# Patient Record
Sex: Male | Born: 1950 | Race: White | Hispanic: No | Marital: Married | Smoking: Former smoker
Health system: Southern US, Community
[De-identification: ages and names within clinical notes are randomized; demographics above are authoritative.]

## PROBLEM LIST (undated history)

## (undated) ENCOUNTER — Emergency Department (HOSPITAL_COMMUNITY)

## (undated) DIAGNOSIS — C069 Malignant neoplasm of mouth, unspecified: Secondary | ICD-10-CM

## (undated) DIAGNOSIS — E785 Hyperlipidemia, unspecified: Secondary | ICD-10-CM

## (undated) DIAGNOSIS — M109 Gout, unspecified: Secondary | ICD-10-CM

## (undated) DIAGNOSIS — E119 Type 2 diabetes mellitus without complications: Secondary | ICD-10-CM

## (undated) DIAGNOSIS — K219 Gastro-esophageal reflux disease without esophagitis: Secondary | ICD-10-CM

## (undated) DIAGNOSIS — Z9889 Other specified postprocedural states: Secondary | ICD-10-CM

## (undated) HISTORY — DX: Hyperlipidemia, unspecified: E78.5

## (undated) HISTORY — DX: Other specified postprocedural states: Z98.890

## (undated) HISTORY — PX: VASECTOMY: SHX75

## (undated) HISTORY — PX: COLONOSCOPY: SHX174

## (undated) HISTORY — PX: KNEE SURGERY: SHX244

---

## 2004-12-19 ENCOUNTER — Ambulatory Visit: Payer: Self-pay | Admitting: Family Medicine

## 2005-02-07 ENCOUNTER — Ambulatory Visit: Payer: Self-pay | Admitting: Internal Medicine

## 2005-03-10 ENCOUNTER — Ambulatory Visit: Payer: Self-pay | Admitting: Internal Medicine

## 2005-04-30 ENCOUNTER — Ambulatory Visit: Payer: Self-pay | Admitting: Internal Medicine

## 2005-06-25 ENCOUNTER — Ambulatory Visit (HOSPITAL_BASED_OUTPATIENT_CLINIC_OR_DEPARTMENT_OTHER): Admission: RE | Admit: 2005-06-25 | Discharge: 2005-06-25 | Payer: Self-pay | Admitting: Orthopedic Surgery

## 2005-06-25 ENCOUNTER — Ambulatory Visit (HOSPITAL_COMMUNITY): Admission: RE | Admit: 2005-06-25 | Discharge: 2005-06-25 | Payer: Self-pay | Admitting: Orthopedic Surgery

## 2013-04-21 DIAGNOSIS — S93401A Sprain of unspecified ligament of right ankle, initial encounter: Secondary | ICD-10-CM | POA: Insufficient documentation

## 2014-06-15 ENCOUNTER — Emergency Department: Payer: Self-pay | Admitting: Emergency Medicine

## 2016-01-21 ENCOUNTER — Other Ambulatory Visit: Payer: Self-pay

## 2016-01-21 ENCOUNTER — Ambulatory Visit (INDEPENDENT_AMBULATORY_CARE_PROVIDER_SITE_OTHER): Payer: No Typology Code available for payment source | Admitting: Gastroenterology

## 2016-01-21 ENCOUNTER — Other Ambulatory Visit: Payer: Self-pay | Admitting: Gastroenterology

## 2016-01-21 ENCOUNTER — Encounter: Payer: Self-pay | Admitting: Gastroenterology

## 2016-01-21 VITALS — BP 103/65 | HR 80 | Temp 97.6°F | Ht 73.0 in | Wt 210.0 lb

## 2016-01-21 DIAGNOSIS — R1310 Dysphagia, unspecified: Secondary | ICD-10-CM

## 2016-01-21 NOTE — Progress Notes (Signed)
Gastroenterology Consultation  Referring Provider:     No ref. provider found Primary Care Physician:  [facility] Primary Gastroenterologist:  Dr. Allen Norris     Reason for Consultation:     Dysphasia        HPI:   Ronnie Strickland is a 65 y.o. y/o male referred for consultation & management of Dysphasia by Dr. Buelah Manis [facility].  This patient comes in today with a history of dysphasia.  The patient states it has been worse over the last 6 months.  The patient had a barium swallow as reported by him that showed esophageal dysmotility.  The patient is usually seen at the [place].  He also reports that he has had some change in his voice. The patient reports that his symptoms are worse with solids more than they are with liquids.  He denies any abdominal pain.  He also reports that he is due for a colonoscopy this year.  There is no report of any unexplained weight loss, fevers, chills, nausea or vomiting.  Past Medical History:  Diagnosis Date  . H/O knee surgery   . Hyperlipidemia     Past Surgical History:  Procedure Laterality Date  . VASECTOMY      Prior to Admission medications   Medication Sig Start Date End Date Taking? Authorizing Provider  atorvastatin (LIPITOR) 80 MG tablet Take 80 mg by mouth daily.   Yes Historical Provider, MD  calcium-vitamin D (OSCAL WITH D) 500-200 MG-UNIT tablet Take 1 tablet by mouth.   Yes Historical Provider, MD  Cholecalciferol (VITAMIN D3) 1000 units CAPS Take by mouth.   Yes Historical Provider, MD  omeprazole (PRILOSEC) 20 MG capsule Take 20 mg by mouth 2 (two) times daily before a meal.   Yes Historical Provider, MD  QUEtiapine (SEROQUEL) 300 MG tablet Take 300 mg by mouth at bedtime.   Yes Historical Provider, MD  sertraline (ZOLOFT) 100 MG tablet Take 100 mg by mouth daily.   Yes Historical Provider, MD    Family History  Problem Relation Age of Onset  . Heart disease Mother   . Arthritis Mother      Social History    Substance Use Topics  . Smoking status: Former Research scientist (life sciences)  . Smokeless tobacco: Never Used  . Alcohol use Yes     Comment: occasonal consumption    Allergies as of 01/21/2016  . (No Known Allergies)    Review of Systems:    All systems reviewed and negative except where noted in HPI.   Physical Exam:  BP 103/65   Pulse 80   Temp 97.6 F (36.4 C) (Oral)   Ht 6\' 1"  (1.854 m)   Wt 210 lb (95.3 kg)   BMI 27.71 kg/m  No LMP for male patient. Psych:  Alert and cooperative. Normal mood and affect. General:   Alert,  Well-developed, well-nourished, pleasant and cooperative in NAD Head:  Normocephalic and atraumatic. Eyes:  Sclera clear, no icterus.   Conjunctiva pink. Ears:  Normal auditory acuity. Nose:  No deformity, discharge, or lesions. Mouth:  No deformity or lesions,oropharynx pink & moist. Neck:  Supple; no masses or thyromegaly. Lungs:  Respirations even and unlabored.  Clear throughout to auscultation.   No wheezes, crackles, or rhonchi. No acute distress. Heart:  Regular rate and rhythm; no murmurs, clicks, rubs, or gallops. Abdomen:  Normal bowel sounds.  No bruits.  Soft, non-tender and non-distended without masses, hepatosplenomegaly or hernias noted.  No guarding or  rebound tenderness.  Negative Carnett sign.   Rectal:  Deferred.  Msk:  Symmetrical without gross deformities.  Good, equal movement & strength bilaterally. Pulses:  Normal pulses noted. Extremities:  No clubbing or edema.  No cyanosis. Neurologic:  Alert and oriented x3;  grossly normal neurologically. Skin:  Intact without significant lesions or rashes.  No jaundice. Lymph Nodes:  No significant cervical adenopathy. Psych:  Alert and cooperative. Normal mood and affect.  Imaging Studies: No results found.  Assessment and Plan:   Ronnie Strickland is a 65 y.o. y/o male Who comes in today with a history of dysphagia that has been worse over the last 6 months.  The patient will be set up for an EGD to rule  out reflux as the cause of his dysphagia and abnormal motility seen on the barium  Esophagram.  The patient may also be suffering from eosinophilic esophagitis. The patient reports that he also like to set up his screening colonoscopy at this time also.  I have discussed risks & benefits which include, but are not limited to, bleeding, infection, perforation & drug reaction.  The patient agrees with this plan & written consent will be obtained.      Note: This dictation was prepared with Dragon dictation along with smaller phrase technology. Any transcriptional errors that result from this process are unintentional.

## 2016-01-22 ENCOUNTER — Other Ambulatory Visit: Payer: Self-pay

## 2016-02-11 ENCOUNTER — Telehealth: Payer: Self-pay

## 2016-02-11 MED ORDER — NA SULFATE-K SULFATE-MG SULF 17.5-3.13-1.6 GM/177ML PO SOLN: 1.0000 | Freq: Once | ORAL | 0 refills | Status: AC

## 2016-02-11 NOTE — Telephone Encounter (Signed)
Patient did not receive his colonoscopy and upper endoscopy packet with instructions and prescription. I sent his prescription to his pharmacy and emailed him the instructions.

## 2016-02-20 ENCOUNTER — Encounter: Payer: Self-pay | Admitting: *Deleted

## 2016-02-21 ENCOUNTER — Telehealth: Payer: Self-pay | Admitting: Gastroenterology

## 2016-02-21 NOTE — Telephone Encounter (Signed)
Patient would like to talk to you regarding a colonoscopy

## 2016-02-21 NOTE — Discharge Instructions (Signed)

## 2016-02-21 NOTE — Telephone Encounter (Signed)
Patient called back to tell me that he can take whatever days come up, not just Fridays. If a day becomes available, I will contact the patient and switch his appointment. As of right now, his appointment is scheduled for 03/21/2016 for a colonoscopy

## 2016-02-29 ENCOUNTER — Telehealth: Payer: Self-pay

## 2016-02-29 NOTE — Telephone Encounter (Signed)
Patient called and wanted to verify his appointment time for his procedure with Dr.Wohl on 03/10/16.  Patient verbalized he had his prescription and instructions and that someone would be calling to let him know what time to be there. All confirmed with patient at this time.

## 2016-03-21 ENCOUNTER — Ambulatory Visit: Payer: Non-veteran care | Admitting: Anesthesiology

## 2016-03-21 ENCOUNTER — Ambulatory Visit
Admission: RE | Admit: 2016-03-21 | Discharge: 2016-03-21 | Disposition: A | Discharge status: Home / Self Care | Payer: Non-veteran care | Source: Ambulatory Visit | Attending: Gastroenterology | Admitting: Gastroenterology

## 2016-03-21 ENCOUNTER — Encounter
Admission: RE | Disposition: A | Discharge status: Home / Self Care | Payer: Self-pay | Source: Ambulatory Visit | Attending: Gastroenterology

## 2016-03-21 DIAGNOSIS — K2970 Gastritis, unspecified, without bleeding: Secondary | ICD-10-CM

## 2016-03-21 DIAGNOSIS — D123 Benign neoplasm of transverse colon: Secondary | ICD-10-CM | POA: Diagnosis not present

## 2016-03-21 DIAGNOSIS — K2950 Unspecified chronic gastritis without bleeding: Secondary | ICD-10-CM | POA: Insufficient documentation

## 2016-03-21 DIAGNOSIS — Z1211 Encounter for screening for malignant neoplasm of colon: Secondary | ICD-10-CM

## 2016-03-21 DIAGNOSIS — E785 Hyperlipidemia, unspecified: Secondary | ICD-10-CM | POA: Diagnosis not present

## 2016-03-21 DIAGNOSIS — K219 Gastro-esophageal reflux disease without esophagitis: Secondary | ICD-10-CM | POA: Diagnosis not present

## 2016-03-21 DIAGNOSIS — M109 Gout, unspecified: Secondary | ICD-10-CM | POA: Diagnosis not present

## 2016-03-21 DIAGNOSIS — R1310 Dysphagia, unspecified: Secondary | ICD-10-CM | POA: Diagnosis not present

## 2016-03-21 DIAGNOSIS — Z87891 Personal history of nicotine dependence: Secondary | ICD-10-CM | POA: Insufficient documentation

## 2016-03-21 DIAGNOSIS — K2990 Gastroduodenitis, unspecified, without bleeding: Secondary | ICD-10-CM | POA: Diagnosis not present

## 2016-03-21 DIAGNOSIS — E119 Type 2 diabetes mellitus without complications: Secondary | ICD-10-CM | POA: Diagnosis not present

## 2016-03-21 DIAGNOSIS — Z79899 Other long term (current) drug therapy: Secondary | ICD-10-CM | POA: Insufficient documentation

## 2016-03-21 DIAGNOSIS — Z86010 Personal history of colonic polyps: Secondary | ICD-10-CM | POA: Diagnosis not present

## 2016-03-21 DIAGNOSIS — K641 Second degree hemorrhoids: Secondary | ICD-10-CM | POA: Diagnosis not present

## 2016-03-21 HISTORY — PX: ESOPHAGOGASTRODUODENOSCOPY (EGD) WITH PROPOFOL: SHX5813

## 2016-03-21 HISTORY — DX: Type 2 diabetes mellitus without complications: E11.9

## 2016-03-21 HISTORY — DX: Gastro-esophageal reflux disease without esophagitis: K21.9

## 2016-03-21 HISTORY — DX: Gout, unspecified: M10.9

## 2016-03-21 HISTORY — PX: COLONOSCOPY WITH PROPOFOL: SHX5780

## 2016-03-21 SURGERY — COLONOSCOPY WITH PROPOFOL
Anesthesia: Monitor Anesthesia Care | Wound class: Contaminated

## 2016-03-21 MED ORDER — PROPOFOL 10 MG/ML IV BOLUS: INTRAVENOUS | Status: DC | PRN

## 2016-03-21 MED ORDER — GLYCOPYRROLATE 0.2 MG/ML IJ SOLN
INTRAMUSCULAR | Status: DC | PRN
  Administered 2016-03-21: 0.2 mg via INTRAVENOUS

## 2016-03-21 MED ORDER — LACTATED RINGERS IV SOLN
INTRAVENOUS | Status: DC
  Administered 2016-03-21: 07:00:00 via INTRAVENOUS

## 2016-03-21 MED ORDER — PROPOFOL 10 MG/ML IV BOLUS
INTRAVENOUS | Status: DC | PRN
  Administered 2016-03-21: 100 mg via INTRAVENOUS
  Administered 2016-03-21 (×2): 50 mg via INTRAVENOUS

## 2016-03-21 MED ORDER — STERILE WATER FOR IRRIGATION IR SOLN
Status: DC | PRN
  Administered 2016-03-21: 08:00:00

## 2016-03-21 MED ORDER — LIDOCAINE HCL (CARDIAC) 20 MG/ML IV SOLN
INTRAVENOUS | Status: DC | PRN
  Administered 2016-03-21: 40 mg via INTRAVENOUS

## 2016-03-21 SURGICAL SUPPLY — 35 items

## 2016-03-21 NOTE — Anesthesia Postprocedure Evaluation (Signed)
Anesthesia Post Note  Patient: Ronnie Strickland  Procedure(s) Performed: Procedure(s) (LRB): COLONOSCOPY WITH PROPOFOL (N/A) ESOPHAGOGASTRODUODENOSCOPY (EGD) WITH PROPOFOL (N/A)  Patient location during evaluation: PACU Anesthesia Type: MAC Level of consciousness: awake and alert Pain management: pain level controlled Vital Signs Assessment: post-procedure vital signs reviewed and stable Respiratory status: spontaneous breathing, nonlabored ventilation, respiratory function stable and patient connected to nasal cannula oxygen Cardiovascular status: stable and blood pressure returned to baseline Anesthetic complications: no    Lindsee Labarre

## 2016-03-21 NOTE — H&P (Signed)
  Lucilla Lame, MD [facility] [address], [place], [place] [address] Phone: [phone] Fax : [phone]  Primary Care Physician:  [facility] Primary Gastroenterologist:  Dr. Allen Norris  Pre-Procedure History & Physical: HPI:  Ronnie Strickland is a 65 y.o. male is here for an endoscopy and colonoscopy.   Past Medical History:  Diagnosis Date  . Diabetes mellitus type 2, diet-controlled ([place])   . GERD (gastroesophageal reflux disease)   . Gout   . H/O knee surgery   . Hyperlipidemia     Past Surgical History:  Procedure Laterality Date  . COLONOSCOPY    . KNEE SURGERY    . VASECTOMY      Prior to Admission medications   Medication Sig Start Date End Date Taking? Authorizing Provider  atorvastatin (LIPITOR) 80 MG tablet Take 80 mg by mouth daily.   Yes Historical Provider, MD  calcium-vitamin D (OSCAL WITH D) 500-200 MG-UNIT tablet Take 1 tablet by mouth.   Yes Historical Provider, MD  Cholecalciferol (VITAMIN D3) 1000 units CAPS Take by mouth.   Yes Historical Provider, MD  Colchicine 0.6 MG CAPS Take by mouth as needed.   Yes Historical Provider, MD  omeprazole (PRILOSEC) 20 MG capsule Take 20 mg by mouth 2 (two) times daily before a meal.   Yes Historical Provider, MD  QUEtiapine (SEROQUEL) 300 MG tablet Take 300 mg by mouth at bedtime.   Yes Historical Provider, MD  sertraline (ZOLOFT) 100 MG tablet Take 100 mg by mouth daily.   Yes Historical Provider, MD    Allergies as of 01/22/2016  . (No Known Allergies)    Family History  Problem Relation Age of Onset  . Heart disease Mother   . Arthritis Mother     Social History   Social History  . Marital status: Married    Spouse name: N/A  . Number of children: N/A  . Years of education: N/A   Occupational History  . Not on file.   Social History Main Topics  . Smoking status: Former Smoker    Types: Cigarettes    Quit date: 07/01/1983  . Smokeless tobacco: Never Used  . Alcohol use Yes   Comment: occasonal consumption  . Drug use: No  . Sexual activity: Not on file   Other Topics Concern  . Not on file   Social History Narrative  . No narrative on file    Review of Systems: See HPI, otherwise negative ROS  Physical Exam: BP (!) 132/92   Pulse 76   Temp 97.4 F (36.3 C) (Temporal)   Resp 16   Ht 6\' 1"  (1.854 m)   Wt 203 lb 8 oz (92.3 kg)   SpO2 99%   BMI 26.85 kg/m  General:   Alert,  pleasant and cooperative in NAD Head:  Normocephalic and atraumatic. Neck:  Supple; no masses or thyromegaly. Lungs:  Clear throughout to auscultation.    Heart:  Regular rate and rhythm. Abdomen:  Soft, nontender and nondistended. Normal bowel sounds, without guarding, and without rebound.   Neurologic:  Alert and  oriented x4;  grossly normal neurologically.  Impression/Plan: Ronnie Strickland is here for an endoscopy and colonoscopy to be performed for history of polyps and dysphaia  Risks, benefits, limitations, and alternatives regarding  endoscopy and colonoscopy have been reviewed with the patient.  Questions have been answered.  All parties agreeable.   Lucilla Lame, MD  03/21/2016, 7:22 AM

## 2016-03-21 NOTE — Anesthesia Preprocedure Evaluation (Signed)
Anesthesia Evaluation  Patient identified by MRN, date of birth, ID band  Reviewed: NPO status   History of Anesthesia Complications Negative for: history of anesthetic complications  Airway Mallampati: II  TM Distance: >3 FB Neck ROM: full    Dental no notable dental hx. (+)    Pulmonary neg pulmonary ROS, former smoker,    Pulmonary exam normal        Cardiovascular Exercise Tolerance: Good negative cardio ROS Normal cardiovascular exam     Neuro/Psych PTSDnegative neurological ROS     GI/Hepatic Neg liver ROS, GERD  Controlled,  Endo/Other  diabetes (diet controlled)  Renal/GU negative Renal ROS  negative genitourinary   Musculoskeletal   Abdominal   Peds  Hematology negative hematology ROS (+)   Anesthesia Other Findings   Reproductive/Obstetrics                             Anesthesia Physical Anesthesia Plan  ASA: II  Anesthesia Plan: MAC   Post-op Pain Management:    Induction:   Airway Management Planned:   Additional Equipment:   Intra-op Plan:   Post-operative Plan:   Informed Consent: I have reviewed the patients History and Physical, chart, labs and discussed the procedure including the risks, benefits and alternatives for the proposed anesthesia with the patient or authorized representative who has indicated his/her understanding and acceptance.     Plan Discussed with: CRNA  Anesthesia Plan Comments:         Anesthesia Quick Evaluation

## 2016-03-21 NOTE — Transfer of Care (Signed)
Immediate Anesthesia Transfer of Care Note  Patient: Ronnie Strickland  Procedure(s) Performed: Procedure(s) with comments: COLONOSCOPY WITH PROPOFOL (N/A) ESOPHAGOGASTRODUODENOSCOPY (EGD) WITH PROPOFOL (N/A) - diabetic - diet controlled  Patient Location: PACU  Anesthesia Type: MAC  Level of Consciousness: awake, alert  and patient cooperative  Airway and Oxygen Therapy: Patient Spontanous Breathing and Patient connected to supplemental oxygen  Post-op Assessment: Post-op Vital signs reviewed, Patient's Cardiovascular Status Stable, Respiratory Function Stable, Patent Airway and No signs of Nausea or vomiting  Post-op Vital Signs: Reviewed and stable  Complications: No apparent anesthesia complications

## 2016-03-21 NOTE — Op Note (Signed)
[facility] Gastroenterology Patient Name: Ronnie Strickland Procedure Date: 03/21/2016 7:34 AM MRN: [id number] Account #: [id number] Date of Birth: 1950-10-26 Admit Type: Outpatient Age: 65 Room: [facility] OR ROOM 01 Gender: Male Note Status: Finalized Procedure:            Upper GI endoscopy Indications:          Dysphagia Providers:            Lucilla Lame MD, MD Referring MD:         Fyffe ***Jarrett Soho, MD (Referring                        MD) Medicines:            Propofol per Anesthesia Complications:        No immediate complications. Procedure:            Pre-Anesthesia Assessment:                       - Prior to the procedure, a History and Physical was                        performed, and patient medications and allergies were                        reviewed. The patient's tolerance of previous                        anesthesia was also reviewed. The risks and benefits of                        the procedure and the sedation options and risks were                        discussed with the patient. All questions were                        answered, and informed consent was obtained. Prior                        Anticoagulants: The patient has taken no previous                        anticoagulant or antiplatelet agents. ASA Grade                        Assessment: II - A patient with mild systemic disease.                        After reviewing the risks and benefits, the patient was                        deemed in satisfactory condition to undergo the                        procedure.                       After obtaining informed consent, the endoscope was  passed under direct vision. Throughout the procedure,                        the patient's blood pressure, pulse, and oxygen                        saturations were monitored continuously. The Olympus                        [id number]-HQ190 Endoscope (S#. [id number]) was  introduced                        through the mouth, and advanced to the second part of                        duodenum. The upper GI endoscopy was accomplished                        without difficulty. The patient tolerated the procedure                        well. Findings:      The examined esophagus was normal.      Localized moderate inflammation characterized by erosions was found in       the gastric antrum. Biopsies were taken with a cold forceps for       histology.      The examined duodenum was normal. Impression:           - Normal esophagus.                       - Gastritis. Biopsied.                       - Normal examined duodenum. Recommendation:       - Discharge patient to home.                       - Resume previous diet.                       - Continue present medications.                       - Await pathology results. Procedure Code(s):    --- Professional ---                       [phone], Esophagogastroduodenoscopy, flexible, transoral;                        with biopsy, single or multiple Diagnosis Code(s):    --- Professional ---                       R13.10, Dysphagia, unspecified                       K29.70, Gastritis, unspecified, without bleeding CPT copyright 2016 American Medical Association. All rights reserved. The codes documented in this report are preliminary and upon coder review may  be revised to meet current compliance requirements. Lucilla Lame MD, MD 03/21/2016 7:45:38 AM This report has been signed electronically. Number of Addenda: 0 Note Initiated On: 03/21/2016  7:34 AM      [facility]

## 2016-03-21 NOTE — Op Note (Signed)
[facility] Gastroenterology Patient Name: Ronnie Strickland Procedure Date: 03/21/2016 7:34 AM MRN: [id number] Account #: [id number] Date of Birth: 05/01/1951 Admit Type: Outpatient Age: 65 Room: [facility] OR ROOM 01 Gender: Male Note Status: Finalized Procedure:            Colonoscopy Indications:          High risk colon cancer surveillance: Personal history                        of colonic polyps Providers:            Lucilla Lame MD, MD Medicines:            Propofol per Anesthesia Complications:        No immediate complications. Procedure:            Pre-Anesthesia Assessment:                       - Prior to the procedure, a History and Physical was                        performed, and patient medications and allergies were                        reviewed. The patient's tolerance of previous                        anesthesia was also reviewed. The risks and benefits of                        the procedure and the sedation options and risks were                        discussed with the patient. All questions were                        answered, and informed consent was obtained. Prior                        Anticoagulants: The patient has taken no previous                        anticoagulant or antiplatelet agents. ASA Grade                        Assessment: II - A patient with mild systemic disease.                        After reviewing the risks and benefits, the patient was                        deemed in satisfactory condition to undergo the                        procedure.                       After obtaining informed consent, the colonoscope was                        passed under direct vision. Throughout the  procedure,                        the patient's blood pressure, pulse, and oxygen                        saturations were monitored continuously. The Olympus                        CF-HQ190L Colonoscope (S#. [id number]) was introduced            through the anus and advanced to the the cecum,                        identified by appendiceal orifice and ileocecal valve.                        The colonoscopy was performed without difficulty. The                        patient tolerated the procedure well. The quality of                        the bowel preparation was excellent. Findings:      The perianal and digital rectal examinations were normal.      A 3 mm polyp was found in the transverse colon. The polyp was sessile.       The polyp was removed with a cold biopsy forceps. Resection and       retrieval were complete.      Non-bleeding internal hemorrhoids were found during retroflexion. The       hemorrhoids were Grade II (internal hemorrhoids that prolapse but reduce       spontaneously). Impression:           - One 3 mm polyp in the transverse colon, removed with                        a cold biopsy forceps. Resected and retrieved.                       - Non-bleeding internal hemorrhoids. Recommendation:       - Repeat colonoscopy in 5 years for surveillance.                       - Discharge patient to home.                       - Resume previous diet.                       - Continue present medications. Procedure Code(s):    --- Professional ---                       [phone], Colonoscopy, flexible; with biopsy, single or                        multiple Diagnosis Code(s):    --- Professional ---                       Z86.010, Personal history of colonic polyps  D12.3, Benign neoplasm of transverse colon (hepatic                        flexure or splenic flexure) CPT copyright 2016 American Medical Association. All rights reserved. The codes documented in this report are preliminary and upon coder review may  be revised to meet current compliance requirements. Lucilla Lame MD, MD 03/21/2016 7:59:17 AM This report has been signed electronically. Number of Addenda: 0 Note Initiated On:  03/21/2016 7:34 AM Scope Withdrawal Time: 0 hours 6 minutes 5 seconds  Total Procedure Duration: 0 hours 8 minutes 11 seconds       [facility]

## 2016-03-24 ENCOUNTER — Encounter: Payer: Self-pay | Admitting: Gastroenterology

## 2016-03-26 ENCOUNTER — Encounter: Payer: Self-pay | Admitting: Gastroenterology

## 2016-10-16 ENCOUNTER — Observation Stay
Admission: EM | Admit: 2016-10-16 | Discharge: 2016-10-17 | Disposition: A | Discharge status: Home / Self Care | Payer: PPO | Attending: Internal Medicine | Admitting: Internal Medicine

## 2016-10-16 ENCOUNTER — Emergency Department: Payer: PPO

## 2016-10-16 DIAGNOSIS — C069 Malignant neoplasm of mouth, unspecified: Secondary | ICD-10-CM | POA: Diagnosis not present

## 2016-10-16 DIAGNOSIS — I951 Orthostatic hypotension: Secondary | ICD-10-CM | POA: Insufficient documentation

## 2016-10-16 DIAGNOSIS — Y842 Radiological procedure and radiotherapy as the cause of abnormal reaction of the patient, or of later complication, without mention of misadventure at the time of the procedure: Secondary | ICD-10-CM | POA: Insufficient documentation

## 2016-10-16 DIAGNOSIS — R1310 Dysphagia, unspecified: Secondary | ICD-10-CM | POA: Diagnosis not present

## 2016-10-16 DIAGNOSIS — I639 Cerebral infarction, unspecified: Secondary | ICD-10-CM | POA: Diagnosis not present

## 2016-10-16 DIAGNOSIS — R251 Tremor, unspecified: Secondary | ICD-10-CM | POA: Insufficient documentation

## 2016-10-16 DIAGNOSIS — R4702 Dysphasia: Secondary | ICD-10-CM | POA: Diagnosis not present

## 2016-10-16 DIAGNOSIS — R202 Paresthesia of skin: Secondary | ICD-10-CM | POA: Insufficient documentation

## 2016-10-16 DIAGNOSIS — G253 Myoclonus: Principal | ICD-10-CM | POA: Insufficient documentation

## 2016-10-16 DIAGNOSIS — K219 Gastro-esophageal reflux disease without esophagitis: Secondary | ICD-10-CM | POA: Diagnosis not present

## 2016-10-16 DIAGNOSIS — E119 Type 2 diabetes mellitus without complications: Secondary | ICD-10-CM | POA: Insufficient documentation

## 2016-10-16 DIAGNOSIS — Z87891 Personal history of nicotine dependence: Secondary | ICD-10-CM | POA: Insufficient documentation

## 2016-10-16 DIAGNOSIS — M109 Gout, unspecified: Secondary | ICD-10-CM | POA: Diagnosis not present

## 2016-10-16 DIAGNOSIS — R200 Anesthesia of skin: Secondary | ICD-10-CM | POA: Diagnosis not present

## 2016-10-16 DIAGNOSIS — G40409 Other generalized epilepsy and epileptic syndromes, not intractable, without status epilepticus: Secondary | ICD-10-CM | POA: Diagnosis not present

## 2016-10-16 DIAGNOSIS — K2990 Gastroduodenitis, unspecified, without bleeding: Secondary | ICD-10-CM | POA: Insufficient documentation

## 2016-10-16 DIAGNOSIS — F329 Major depressive disorder, single episode, unspecified: Secondary | ICD-10-CM | POA: Diagnosis not present

## 2016-10-16 DIAGNOSIS — R4781 Slurred speech: Secondary | ICD-10-CM | POA: Insufficient documentation

## 2016-10-16 DIAGNOSIS — R471 Dysarthria and anarthria: Secondary | ICD-10-CM | POA: Insufficient documentation

## 2016-10-16 DIAGNOSIS — Z79899 Other long term (current) drug therapy: Secondary | ICD-10-CM | POA: Insufficient documentation

## 2016-10-16 DIAGNOSIS — E785 Hyperlipidemia, unspecified: Secondary | ICD-10-CM | POA: Diagnosis not present

## 2016-10-16 DIAGNOSIS — T66XXXA Radiation sickness, unspecified, initial encounter: Secondary | ICD-10-CM | POA: Insufficient documentation

## 2016-10-16 DIAGNOSIS — Z7982 Long term (current) use of aspirin: Secondary | ICD-10-CM | POA: Insufficient documentation

## 2016-10-16 DIAGNOSIS — Z91018 Allergy to other foods: Secondary | ICD-10-CM | POA: Diagnosis not present

## 2016-10-16 DIAGNOSIS — R296 Repeated falls: Secondary | ICD-10-CM | POA: Diagnosis not present

## 2016-10-16 DIAGNOSIS — D123 Benign neoplasm of transverse colon: Secondary | ICD-10-CM | POA: Insufficient documentation

## 2016-10-16 HISTORY — DX: Malignant neoplasm of mouth, unspecified: C06.9

## 2016-10-16 LAB — URINE DRUG SCREEN, QUALITATIVE (ARMC ONLY)
Amphetamines, Ur Screen: NOT DETECTED
BARBITURATES, UR SCREEN: NOT DETECTED
Benzodiazepine, Ur Scrn: NOT DETECTED
CANNABINOID 50 NG, UR ~~LOC~~: NOT DETECTED
COCAINE METABOLITE, UR ~~LOC~~: NOT DETECTED
MDMA (Ecstasy)Ur Screen: NOT DETECTED
Methadone Scn, Ur: NOT DETECTED
OPIATE, UR SCREEN: NOT DETECTED
Phencyclidine (PCP) Ur S: NOT DETECTED
TRICYCLIC, UR SCREEN: POSITIVE — AB

## 2016-10-16 LAB — CBC WITH DIFFERENTIAL/PLATELET
BASOS ABS: 0 10*3/uL (ref 0–0.1)
Basophils Relative: 1 %
Eosinophils Absolute: 0.2 10*3/uL (ref 0–0.7)
Eosinophils Relative: 4 %
HEMATOCRIT: 36.7 % — AB (ref 40.0–52.0)
Hemoglobin: 12.4 g/dL — ABNORMAL LOW (ref 13.0–18.0)
LYMPHS ABS: 0.7 10*3/uL — AB (ref 1.0–3.6)
LYMPHS PCT: 18 %
MCH: 30.9 pg (ref 26.0–34.0)
MCHC: 33.9 g/dL (ref 32.0–36.0)
MCV: 91.4 fL (ref 80.0–100.0)
MONOS PCT: 8 %
Monocytes Absolute: 0.3 10*3/uL (ref 0.2–1.0)
NEUTROS ABS: 2.5 10*3/uL (ref 1.4–6.5)
NEUTROS PCT: 69 %
Platelets: 92 10*3/uL — ABNORMAL LOW (ref 150–440)
RBC: 4.02 MIL/uL — ABNORMAL LOW (ref 4.40–5.90)
RDW: 15.4 % — AB (ref 11.5–14.5)
WBC: 3.7 10*3/uL — AB (ref 3.8–10.6)

## 2016-10-16 LAB — URINALYSIS, ROUTINE W REFLEX MICROSCOPIC
BILIRUBIN URINE: NEGATIVE
Glucose, UA: NEGATIVE mg/dL
Hgb urine dipstick: NEGATIVE
KETONES UR: NEGATIVE mg/dL
Leukocytes, UA: NEGATIVE
Nitrite: NEGATIVE
Protein, ur: NEGATIVE mg/dL
SPECIFIC GRAVITY, URINE: 1.004 — AB (ref 1.005–1.030)
pH: 6 (ref 5.0–8.0)

## 2016-10-16 LAB — COMPREHENSIVE METABOLIC PANEL
ALT: 18 U/L (ref 17–63)
ANION GAP: 4 — AB (ref 5–15)
AST: 19 U/L (ref 15–41)
Albumin: 3.7 g/dL (ref 3.5–5.0)
Alkaline Phosphatase: 78 U/L (ref 38–126)
BUN: 26 mg/dL — ABNORMAL HIGH (ref 6–20)
CHLORIDE: 106 mmol/L (ref 101–111)
CO2: 30 mmol/L (ref 22–32)
Calcium: 8.7 mg/dL — ABNORMAL LOW (ref 8.9–10.3)
Creatinine, Ser: 1.19 mg/dL (ref 0.61–1.24)
Glucose, Bld: 95 mg/dL (ref 65–99)
POTASSIUM: 4.2 mmol/L (ref 3.5–5.1)
Sodium: 140 mmol/L (ref 135–145)
Total Bilirubin: 0.5 mg/dL (ref 0.3–1.2)
Total Protein: 6.5 g/dL (ref 6.5–8.1)

## 2016-10-16 LAB — TROPONIN I: Troponin I: <0.03 ng/mL (ref <0.03)

## 2016-10-16 LAB — PROTIME-INR
INR: 1.08
PROTHROMBIN TIME: 14 s (ref 11.4–15.2)

## 2016-10-16 LAB — ETHANOL

## 2016-10-16 LAB — APTT: aPTT: 27 seconds (ref 24–36)

## 2016-10-16 MED ORDER — GADOBENATE DIMEGLUMINE 529 MG/ML IV SOLN
20.0000 mL | Freq: Once | INTRAVENOUS | Status: AC | PRN
  Administered 2016-10-17: 17 mL via INTRAVENOUS

## 2016-10-16 NOTE — ED Notes (Signed)
Neuro SOC in progress

## 2016-10-16 NOTE — ED Notes (Signed)
Pt's speech noted to be slurred at this time by this RN.

## 2016-10-16 NOTE — ED Provider Notes (Signed)
[facility] Emergency Department Provider Note  ____________________________________________   First MD Initiated Contact with Patient 10/16/16 2207     (approximate)  I have reviewed the triage vital signs and the nursing notes.   HISTORY  Chief Complaint Weakness   HPI Ronnie Strickland is a 66 y.o. 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 The patient says that he was standing up from sitting at a table when he began to feel a jerking sensation in his legs that caused him to fall. He says that he has been having jerks in his legs as well as arms over the past one hour. He is also reporting numbness to the right side of his face which she says is new. However, this is the side where he had a salivary gland removed and had about 24 radiation treatments. He had the mass removed this past November. Also, recently the patient has been having multiple falls at home over the past month. He has been having issues with low blood pressure and has been started on Midrin as well as sodium bicarbonate tablets. He says he has been compliant with these medications. He denies any pain. EMS also reported that his response to questions earlier this evening were "slow."   Past Medical History:  Diagnosis Date  . Diabetes mellitus type 2, diet-controlled ([place])   . GERD (gastroesophageal reflux disease)   . Gout   . H/O knee surgery   . Hyperlipidemia     Patient Active Problem List   Diagnosis Date Noted  . Dysphagia   . Gastritis and gastroduodenitis   . Special screening for malignant neoplasms, colon   . Benign neoplasm of transverse colon   . Right ankle sprain 04/21/2013    Past Surgical History:  Procedure Laterality Date  . COLONOSCOPY    . COLONOSCOPY WITH PROPOFOL N/A 03/21/2016   Procedure: COLONOSCOPY WITH PROPOFOL;  Surgeon: Lucilla Lame,  MD;  Location: [place];  Service: Endoscopy;  Laterality: N/A;  . ESOPHAGOGASTRODUODENOSCOPY (EGD) WITH PROPOFOL N/A 03/21/2016   Procedure: ESOPHAGOGASTRODUODENOSCOPY (EGD) WITH PROPOFOL;  Surgeon: Lucilla Lame, MD;  Location: [place];  Service: Endoscopy;  Laterality: N/A;  diabetic - diet controlled  . KNEE SURGERY    . VASECTOMY      Prior to Admission medications   Medication Sig Start Date End Date Taking? Authorizing Provider  atorvastatin (LIPITOR) 80 MG tablet Take 80 mg by mouth daily.    Historical Provider, MD  calcium-vitamin D (OSCAL WITH D) 500-200 MG-UNIT tablet Take 1 tablet by mouth.    Historical Provider, MD  Cholecalciferol (VITAMIN D3) 1000 units CAPS Take by mouth.    Historical Provider, MD  Colchicine 0.6 MG CAPS Take by mouth as needed.    Historical Provider, MD  omeprazole (PRILOSEC) 20 MG capsule Take 20 mg by mouth 2 (two) times daily before a meal.    Historical Provider, MD  QUEtiapine (SEROQUEL) 300 MG tablet Take 300 mg by mouth at bedtime.    Historical Provider, MD  sertraline (ZOLOFT) 100 MG tablet Take 100 mg by mouth daily.    Historical Provider, MD    Allergies Tomato  Family History  Problem Relation Age of Onset  . Heart disease Mother   . Arthritis Mother     Social History Social History  Substance Use Topics  . Smoking status: Former  Smoker    Types: Cigarettes    Quit date: 07/01/1983  . Smokeless tobacco: Never Used  . Alcohol use Yes     Comment: occasonal consumption    Review of Systems Constitutional: No fever/chills Eyes: No visual changes. ENT: No sore throat. Cardiovascular: Denies chest pain. Respiratory: Denies shortness of breath. Gastrointestinal: No abdominal pain.  No nausea, no vomiting.  No diarrhea.  No constipation. Genitourinary: Negative for dysuria. Musculoskeletal: Negative for back pain. Skin: Negative for rash. Neurological: Negative for headaches, focal weakness  10-point ROS  otherwise negative.  ____________________________________________   PHYSICAL EXAM:  VITAL SIGNS: ED Triage Vitals  Enc Vitals Group     BP 10/16/16 2203 (!) 151/100     Pulse Rate 10/16/16 2203 87     Resp 10/16/16 2203 14     Temp 10/16/16 2204 98.2 F (36.8 C)     Temp Source 10/16/16 2204 Oral     SpO2 10/16/16 2203 100 %     Weight 10/16/16 2204 182 lb (82.6 kg)     Height 10/16/16 2204 6' (1.829 m)     Head Circumference --      Peak Flow --      Pain Score 10/16/16 2204 8     Pain Loc --      Pain Edu? --      Excl. in [place]? --     Constitutional: Alert and oriented. Well appearing and in no acute distress. Intermittent myoclonic jerking of the bilateral upper and lower extremities. The upper extremities jerking at separate times in the lower external nares. However, when the upper extremities jerked they are symmetric and this is also true of the lower extremities. Eyes: Conjunctivae are normal. PERRL. EOMI. Head: Atraumatic. Nose: No congestion/rhinnorhea. Mouth/Throat: Mucous membranes are moist.   Neck: No stridor.   Cardiovascular: Normal rate, regular rhythm. Grossly normal heart sounds.   Respiratory: Normal respiratory effort.  No retractions. Lungs CTAB. Gastrointestinal: Soft and nontender. No distention. No abdominal bruits. No CVA tenderness. Musculoskeletal: No lower extremity tenderness nor edema.  No joint effusions. Neurologic:  Normal speech and language. Mild sensation to light touch deficits to the right side of the face. Normal speech. 5 out of 5 strength throughout. Skin:  Skin is warm, dry and intact. No rash noted. Psychiatric: Mood and affect are normal. Speech and behavior are normal.  NIH Stroke Scale  Person Administering Scale: Doran Stabler  Administer stroke scale items in the order listed. Record performance in each category after each subscale exam. Do not go back and change scores. Follow directions provided for each exam  technique. Scores should reflect what the patient does, not what the clinician thinks the patient can do. The clinician should record answers while administering the exam and work quickly. Except where indicated, the patient should not be coached (i.e., repeated requests to patient to make a special effort).   1a  Level of consciousness: 0=alert; keenly responsive  1b. LOC questions:  0=Performs both tasks correctly  1c. LOC commands: 0=Performs both tasks correctly  2.  Best Gaze: 0=normal  3.  Visual: 0=No visual loss  4. Facial Palsy: 0=Normal symmetric movement  5a.  Motor left arm: 0=No drift, limb holds 90 (or 45) degrees for full 10 seconds  5b.  Motor right arm: 0=No drift, limb holds 90 (or 45) degrees for full 10 seconds  6a. motor left leg: 0=No drift, limb holds 90 (or 45) degrees for full 10 seconds  6b  Motor right leg:  0=No drift, limb holds 90 (or 45) degrees for full 10 seconds  7. Limb Ataxia: 0=Absent  8.  Sensory: 1=Mild to moderate sensory loss; patient feels pinprick is less sharp or is dull on the affected side; there is a loss of superficial pain with pinprick but patient is aware He is being touched  9. Best Language:  0=No aphasia, normal  10. Dysarthria: 1=Mild to moderate, patient slurs at least some words and at worst, can be understood with some difficulty  11. Extinction and Inattention: 0=No abnormality  12. Distal motor function: 0=Normal   Total:   2    ____________________________________________   LABS (all labs ordered are listed, but only abnormal results are displayed)  Labs Reviewed  COMPREHENSIVE METABOLIC PANEL - Abnormal; Notable for the following:       Result Value   BUN 26 (*)    Calcium 8.7 (*)    Anion gap 4 (*)    All other components within normal limits  CBC WITH DIFFERENTIAL/PLATELET - Abnormal; Notable for the following:    WBC 3.7 (*)    RBC 4.02 (*)    Hemoglobin 12.4 (*)    HCT 36.7 (*)    RDW 15.4 (*)    Platelets 92  (*)    Lymphs Abs 0.7 (*)    All other components within normal limits  TROPONIN I  ETHANOL  PROTIME-INR  APTT  URINALYSIS, ROUTINE W REFLEX MICROSCOPIC  URINE DRUG SCREEN, QUALITATIVE ([facility] ONLY)   ____________________________________________  EKG  ED ECG REPORT I, Doran Stabler, the attending physician, personally viewed and interpreted this ECG.   Date: 10/16/2016  EKG Time: 2202  Rate: 81  Rhythm: normal sinus rhythm  Axis: Normal  Intervals:none  ST&T Change: No ST segment elevation or depression. No abnormal T-wave inversion.  ____________________________________________  [id number]    CT Head Code Stroke W/O CM (Final result)  Result time 10/16/16 22:38:20  Final result by Jeannine Boga, MD (10/16/16 22:38:20)           Narrative:   CLINICAL DATA: Code stroke. Initial evaluation for acute slurred speech.  EXAM: CT HEAD WITHOUT CONTRAST  TECHNIQUE: Contiguous axial images were obtained from the base of the skull through the vertex without intravenous contrast.  COMPARISON: None available.  FINDINGS: Brain: Generalized age-related cerebral atrophy. No acute intracranial hemorrhage. No evidence for acute large vessel territory infarct. No mass lesion, midline shift or mass effect. No hydrocephalus. No extra-axial fluid collection. Subcentimeter calcification noted at the inferior aspect of the fourth ventricle.  Vascular: No hyperdense vessel.  Skull: Scalp soft tissues within normal limits. Calvarium intact.  Sinuses/Orbits: Globes and oval soft tissues within normal limits. Paranasal sinuses are clear. No mastoid effusion.  Other: None.  ASPECTS [facility] Stroke Program Early CT Score)  - Ganglionic level infarction (caudate, lentiform nuclei, internal capsule, insula, M1-M3 cortex): 7  - Supraganglionic infarction (M4-M6 cortex): 3  Total score (0-10 with 10 being normal): 10  IMPRESSION: 1. No acute intracranial infarct  or other process identified. 2. ASPECTS is 10 Critical Value/emergent results were called by telephone at the time of interpretation on 10/16/2016 at 10:35 pm to Dr. Larae Grooms , who verbally acknowledged these results.   Electronically Signed By: Jeannine Boga M.D. On: 10/16/2016 22:38          ____________________________________________   PROCEDURES  Procedure(s) performed:   Procedures  Critical Care performed:   ____________________________________________   INITIAL IMPRESSION /  ASSESSMENT AND PLAN / ED COURSE  Pertinent labs & imaging results that were available during my care of the patient were reviewed by me and considered in my medical decision making (see chart for details).  ----------------------------------------- 0865 PM on 10/16/2016 -----------------------------------------  Patient was initially made a stroke alert. Discussed the case with the tele-neurologist, Dr. Lorette Ang, who feels that TPA would be more risk than benefit at this time. Feels that there is likely a metabolic explanation for the symptoms. Plan for further neurologic workup and likely transferred to the [place]. Discussed the case with the family as well as the patient. Signed out to Dr. Owens Shark.      ____________________________________________   FINAL CLINICAL IMPRESSION(S) / ED DIAGNOSES  Paresthesia. Myoclonic jerks.    NEW MEDICATIONS STARTED DURING THIS VISIT:  New Prescriptions   No medications on file     Note:  This document was prepared using Dragon voice recognition software and may include unintentional dictation errors.    Orbie Pyo, MD 10/16/16 [id number]

## 2016-10-16 NOTE — ED Triage Notes (Signed)
Pt came in via [facility] from home.  Pt was at home and approximately an hour ago started to feel weak per family.  Pt states that he felt like his muscles in his legs were contracting and caused him to stumble.  Family told EMS that they were concerned for a possible stroke.  Family also reported to EMS that the patient's speech has been more delayed than normal.  Pt A&Ox4 and able to answer all questions the EDP asks.

## 2016-10-16 NOTE — ED Notes (Signed)
Pt to CT via stretcher accomp by Corky Sing, RN

## 2016-10-16 NOTE — ED Notes (Addendum)
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

## 2016-10-16 NOTE — ED Notes (Addendum)
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

## 2016-10-16 NOTE — ED Notes (Signed)
EDP at bedside for possible code stroke

## 2016-10-17 ENCOUNTER — Observation Stay: Payer: PPO

## 2016-10-17 ENCOUNTER — Observation Stay (HOSPITAL_BASED_OUTPATIENT_CLINIC_OR_DEPARTMENT_OTHER)
Admit: 2016-10-17 | Discharge: 2016-10-17 | Disposition: A | Discharge status: Home / Self Care | Payer: PPO | Attending: Internal Medicine | Admitting: Internal Medicine

## 2016-10-17 DIAGNOSIS — G253 Myoclonus: Secondary | ICD-10-CM | POA: Diagnosis not present

## 2016-10-17 DIAGNOSIS — I6350 Cerebral infarction due to unspecified occlusion or stenosis of unspecified cerebral artery: Secondary | ICD-10-CM

## 2016-10-17 DIAGNOSIS — R471 Dysarthria and anarthria: Secondary | ICD-10-CM

## 2016-10-17 DIAGNOSIS — R4781 Slurred speech: Secondary | ICD-10-CM | POA: Diagnosis not present

## 2016-10-17 DIAGNOSIS — I639 Cerebral infarction, unspecified: Secondary | ICD-10-CM | POA: Diagnosis not present

## 2016-10-17 LAB — LIPID PANEL
Cholesterol: 92 mg/dL (ref 0–200)
HDL: 39 mg/dL — ABNORMAL LOW (ref >40)
LDL Cholesterol: 36 mg/dL (ref 0–99)
Total CHOL/HDL Ratio: 2.4 RATIO
Triglycerides: 87 mg/dL (ref <150)
VLDL: 17 mg/dL (ref 0–40)

## 2016-10-17 LAB — GLUCOSE, CAPILLARY: Glucose-Capillary: 89 mg/dL (ref 65–99)

## 2016-10-17 MED ORDER — DICLOFENAC SODIUM 1 % TD GEL
4.0000 g | Freq: Three times a day (TID) | TRANSDERMAL | Status: DC | PRN
  Filled 2016-10-17: qty 100

## 2016-10-17 MED ORDER — ASPIRIN 300 MG RE SUPP: 300.0000 mg | Freq: Every day | RECTAL | Status: DC

## 2016-10-17 MED ORDER — LIDOCAINE VISCOUS 2 % MT SOLN
15.0000 mL | Freq: Three times a day (TID) | OROMUCOSAL | Status: DC | PRN
  Filled 2016-10-17: qty 15

## 2016-10-17 MED ORDER — ATORVASTATIN CALCIUM 20 MG PO TABS: 40.0000 mg | ORAL_TABLET | Freq: Every day | ORAL | Status: DC

## 2016-10-17 MED ORDER — SERTRALINE HCL 100 MG PO TABS: 200.0000 mg | ORAL_TABLET | Freq: Every day | ORAL | Status: DC

## 2016-10-17 MED ORDER — ENOXAPARIN SODIUM 40 MG/0.4ML ~~LOC~~ SOLN: 40.0000 mg | SUBCUTANEOUS | Status: DC

## 2016-10-17 MED ORDER — SODIUM CHLORIDE 0.9 % IV SOLN
INTRAVENOUS | Status: DC
  Administered 2016-10-17: 06:00:00 via INTRAVENOUS

## 2016-10-17 MED ORDER — NYSTATIN 100000 UNIT/ML MT SUSP: 10.0000 mL | Freq: Two times a day (BID) | OROMUCOSAL | Status: DC

## 2016-10-17 MED ORDER — QUETIAPINE FUMARATE 25 MG PO TABS: 100.0000 mg | ORAL_TABLET | Freq: Every day | ORAL | Status: DC

## 2016-10-17 MED ORDER — OXYCODONE HCL 5 MG PO TABS: 5.0000 mg | ORAL_TABLET | Freq: Four times a day (QID) | ORAL | Status: DC | PRN

## 2016-10-17 MED ORDER — ACETAMINOPHEN 650 MG RE SUPP: 650.0000 mg | RECTAL | Status: DC | PRN

## 2016-10-17 MED ORDER — STROKE: EARLY STAGES OF RECOVERY BOOK
Freq: Once | Status: AC
  Administered 2016-10-17: 05:00:00

## 2016-10-17 MED ORDER — SENNOSIDES-DOCUSATE SODIUM 8.6-50 MG PO TABS: 1.0000 | ORAL_TABLET | Freq: Every evening | ORAL | Status: DC | PRN

## 2016-10-17 MED ORDER — LATANOPROST 0.005 % OP SOLN
1.0000 [drp] | Freq: Every day | OPHTHALMIC | Status: DC
  Filled 2016-10-17: qty 2.5

## 2016-10-17 MED ORDER — FLUDROCORTISONE ACETATE 0.1 MG PO TABS
0.1000 mg | ORAL_TABLET | Freq: Every day | ORAL | Status: DC
  Filled 2016-10-17: qty 1

## 2016-10-17 MED ORDER — ASPIRIN 325 MG PO TABS: 325.0000 mg | ORAL_TABLET | Freq: Every day | ORAL | Status: DC

## 2016-10-17 MED ORDER — ACETAMINOPHEN 160 MG/5ML PO SOLN
650.0000 mg | ORAL | Status: DC | PRN
  Filled 2016-10-17: qty 20.3

## 2016-10-17 MED ORDER — VITAMIN D 1000 UNITS PO TABS: 2000.0000 [IU] | ORAL_TABLET | Freq: Every day | ORAL | Status: DC

## 2016-10-17 MED ORDER — ACETAMINOPHEN 325 MG PO TABS: 650.0000 mg | ORAL_TABLET | ORAL | Status: DC | PRN

## 2016-10-17 MED ORDER — LIDOCAINE VISCOUS 2 % MT SOLN
20.0000 mL | Freq: Three times a day (TID) | OROMUCOSAL | Status: DC | PRN
  Filled 2016-10-17: qty 20

## 2016-10-17 MED ORDER — MIDODRINE HCL 5 MG PO TABS: 10.0000 mg | ORAL_TABLET | Freq: Three times a day (TID) | ORAL | Status: DC

## 2016-10-17 NOTE — H&P (Signed)
Ronnie Strickland is an 66 y.o. male.   Chief Complaint: Weakness HPI: The patient with past medical history of oral cancer and diabetes presents to the emergency department after an episode of tremulousness and weakness. The patient's family arrived at home to find him with dysarthric speech. The patient mentioned at that time that the right side of his face felt numb and he complained of tingling in his arms and legs bilaterally. He rose to stand but nearly fell due to weakness. His wife walked him to his bedroom where he then had an episode of tremors during which she was conscious and responsive. He did not lose continence of bowel or bladder but afterwards felt very very weak. In the emergency department CT and MRI of the patient's brain showed no acute intracranial abnormalities. However, the patient's dysarthria persisted which prompted the emergency [place] staff to call the hospitalist service for admission.  Past Medical History:  Diagnosis Date  . Diabetes mellitus type 2, diet-controlled ([place])   . GERD (gastroesophageal reflux disease)   . Gout   . H/O knee surgery   . Hyperlipidemia   . Oral cancer [facility])     Past Surgical History:  Procedure Laterality Date  . COLONOSCOPY    . COLONOSCOPY WITH PROPOFOL N/A 03/21/2016   Procedure: COLONOSCOPY WITH PROPOFOL;  Surgeon: Lucilla Lame, MD;  Location: [place];  Service: Endoscopy;  Laterality: N/A;  . ESOPHAGOGASTRODUODENOSCOPY (EGD) WITH PROPOFOL N/A 03/21/2016   Procedure: ESOPHAGOGASTRODUODENOSCOPY (EGD) WITH PROPOFOL;  Surgeon: Lucilla Lame, MD;  Location: [place];  Service: Endoscopy;  Laterality: N/A;  diabetic - diet controlled  . KNEE SURGERY    . VASECTOMY      Family History  Problem Relation Age of Onset  . Heart disease Mother   . Arthritis Mother    Social History:  reports that he quit smoking about 33 years ago. His smoking use included Cigarettes. He has never used smokeless tobacco. He reports that  he drinks alcohol. He reports that he does not use drugs.  Allergies:  Allergies  Allergen Reactions  . Tomato Hives    Medications Prior to Admission  Medication Sig Dispense Refill  . ARTIFICIAL SALIVA MT Use as directed 1 application in the mouth or throat every 4 (four) hours as needed (dry mouth).    Marland Kitchen atorvastatin (LIPITOR) 80 MG tablet Take 40 mg by mouth daily.     . Cholecalciferol (VITAMIN D3) 1000 units CAPS Take 2,000 Units by mouth daily.     . diclofenac sodium (VOLTAREN) 1 % GEL Apply 4 g topically 3 (three) times daily as needed (pain).    . fludrocortisone (FLORINEF) 0.1 MG tablet Take 0.1 mg by mouth daily.    Marland Kitchen latanoprost (XALATAN) 0.005 % ophthalmic solution Place 1 drop into both eyes at bedtime.    . lidocaine (XYLOCAINE) 2 % solution Use as directed 20 mLs in the mouth or throat 3 (three) times daily as needed for mouth pain.    . midodrine (PROAMATINE) 5 MG tablet Take 10 mg by mouth 3 (three) times daily with meals.    . nystatin (MYCOSTATIN) 100000 UNIT/ML suspension Take 10 mLs by mouth 2 (two) times daily. Swish and swallow    . omeprazole (PRILOSEC) 20 MG capsule Take 20 mg by mouth 2 (two) times daily before a meal.    . oxyCODONE (OXY IR/ROXICODONE) 5 MG immediate release tablet Take 5 mg by mouth every 6 (six) hours as needed for severe pain.    Marland Kitchen  QUEtiapine (SEROQUEL) 100 MG tablet Take 100 mg by mouth at bedtime. Take 162m at bedtime for 14 days. Then take 532mat bedtime for 7 days. Then stop.    . Marland Kitchenertraline (ZOLOFT) 100 MG tablet Take 200 mg by mouth daily.       Results for orders placed or performed during the hospital encounter of 10/16/16 (from the past 48 hour(s))  Comprehensive metabolic panel     Status: Abnormal   Collection Time: 10/16/16 10:06 PM  Result Value Ref Range   Sodium 140 135 - 145 mmol/L   Potassium 4.2 3.5 - 5.1 mmol/L   Chloride 106 101 - 111 mmol/L   CO2 30 22 - 32 mmol/L   Glucose, Bld 95 65 - 99 mg/dL   BUN 26 (H) 6 -  20 mg/dL   Creatinine, Ser 1.19 0.61 - 1.24 mg/dL   Calcium 8.7 (L) 8.9 - 10.3 mg/dL   Total Protein 6.5 6.5 - 8.1 g/dL   Albumin 3.7 3.5 - 5.0 g/dL   AST 19 15 - 41 U/L   ALT 18 17 - 63 U/L   Alkaline Phosphatase 78 38 - 126 U/L   Total Bilirubin 0.5 0.3 - 1.2 mg/dL   GFR calc non Af Amer >60 >60 mL/min   GFR calc Af Amer >60 >60 mL/min    Comment: (NOTE) The eGFR has been calculated using the CKD EPI equation. This calculation has not been validated in all clinical situations. eGFR's persistently <60 mL/min signify possible Chronic Kidney Disease.    Anion gap 4 (L) 5 - 15  CBC with Differential     Status: Abnormal   Collection Time: 10/16/16 10:06 PM  Result Value Ref Range   WBC 3.7 (L) 3.8 - 10.6 K/uL   RBC 4.02 (L) 4.40 - 5.90 MIL/uL   Hemoglobin 12.4 (L) 13.0 - 18.0 g/dL   HCT 36.7 (L) 40.0 - 52.0 %   MCV 91.4 80.0 - 100.0 fL   MCH 30.9 26.0 - 34.0 pg   MCHC 33.9 32.0 - 36.0 g/dL   RDW 15.4 (H) 11.5 - 14.5 %   Platelets 92 (L) 150 - 440 K/uL   Neutrophils Relative % 69 %   Neutro Abs 2.5 1.4 - 6.5 K/uL   Lymphocytes Relative 18 %   Lymphs Abs 0.7 (L) 1.0 - 3.6 K/uL   Monocytes Relative 8 %   Monocytes Absolute 0.3 0.2 - 1.0 K/uL   Eosinophils Relative 4 %   Eosinophils Absolute 0.2 0 - 0.7 K/uL   Basophils Relative 1 %   Basophils Absolute 0.0 0 - 0.1 K/uL  Troponin I     Status: None   Collection Time: 10/16/16 10:06 PM  Result Value Ref Range   Troponin I <0.03 <0.03 ng/mL  Ethanol     Status: None   Collection Time: 10/16/16 10:06 PM  Result Value Ref Range   Alcohol, Ethyl (B) <5 <5 mg/dL    Comment:        LOWEST DETECTABLE LIMIT FOR SERUM ALCOHOL IS 5 mg/dL FOR MEDICAL PURPOSES ONLY   Protime-INR     Status: None   Collection Time: 10/16/16 10:06 PM  Result Value Ref Range   Prothrombin Time 14.0 11.4 - 15.2 seconds   INR 1.08   APTT     Status: None   Collection Time: 10/16/16 10:06 PM  Result Value Ref Range   aPTT 27 24 - 36 seconds   Urinalysis, Routine w reflex microscopic  Status: Abnormal   Collection Time: 10/16/16 11:04 PM  Result Value Ref Range   Color, Urine STRAW (A) YELLOW   APPearance CLEAR (A) CLEAR   Specific Gravity, Urine 1.004 (L) 1.005 - 1.030   pH 6.0 5.0 - 8.0   Glucose, UA NEGATIVE NEGATIVE mg/dL   Hgb urine dipstick NEGATIVE NEGATIVE   Bilirubin Urine NEGATIVE NEGATIVE   Ketones, ur NEGATIVE NEGATIVE mg/dL   Protein, ur NEGATIVE NEGATIVE mg/dL   Nitrite NEGATIVE NEGATIVE   Leukocytes, UA NEGATIVE NEGATIVE  Urine Drug Screen, Qualitative ([facility] only)     Status: Abnormal   Collection Time: 10/16/16 11:04 PM  Result Value Ref Range   Tricyclic, Ur Screen POSITIVE (A) NONE DETECTED   Amphetamines, Ur Screen NONE DETECTED NONE DETECTED   MDMA (Ecstasy)Ur Screen NONE DETECTED NONE DETECTED   Cocaine Metabolite,Ur [place] NONE DETECTED NONE DETECTED   Opiate, Ur Screen NONE DETECTED NONE DETECTED   Phencyclidine (PCP) Ur S NONE DETECTED NONE DETECTED   Cannabinoid 50 Ng, Ur [place] NONE DETECTED NONE DETECTED   Barbiturates, Ur Screen NONE DETECTED NONE DETECTED   Benzodiazepine, Ur Scrn NONE DETECTED NONE DETECTED   Methadone Scn, Ur NONE DETECTED NONE DETECTED    Comment: (NOTE) 546  Tricyclics, urine               Cutoff 1000 ng/mL 200  Amphetamines, urine             Cutoff 1000 ng/mL 300  MDMA (Ecstasy), urine           Cutoff 500 ng/mL 400  Cocaine Metabolite, urine       Cutoff 300 ng/mL 500  Opiate, urine                   Cutoff 300 ng/mL 600  Phencyclidine (PCP), urine      Cutoff 25 ng/mL 700  Cannabinoid, urine              Cutoff 50 ng/mL 800  Barbiturates, urine             Cutoff 200 ng/mL 900  Benzodiazepine, urine           Cutoff 200 ng/mL 1000 Methadone, urine                Cutoff 300 ng/mL 1100 1200 The urine drug screen provides only a preliminary, unconfirmed 1300 analytical test result and should not be used for non-medical 1400 purposes. Clinical consideration and  professional judgment should 1500 be applied to any positive drug screen result due to possible 1600 interfering substances. A more specific alternate chemical method 1700 must be used in order to obtain a confirmed analytical result.  1800 Gas chromato graphy / mass spectrometry (GC/MS) is the preferred 1900 confirmatory method.   Lipid panel     Status: Abnormal   Collection Time: 10/17/16  5:06 AM  Result Value Ref Range   Cholesterol 92 0 - 200 mg/dL   Triglycerides 87 <150 mg/dL   HDL 39 (L) >40 mg/dL   Total CHOL/HDL Ratio 2.4 RATIO   VLDL 17 0 - 40 mg/dL   LDL Cholesterol 36 0 - 99 mg/dL    Comment:        Total Cholesterol/HDL:CHD Risk Coronary Heart Disease Risk Table                     Men   Women  1/2 Average Risk   3.4   3.3  Average Risk       5.0   4.4  2 X Average Risk   9.6   7.1  3 X Average Risk  23.4   11.0        Use the calculated Patient Ratio above and the CHD Risk Table to determine the patient's CHD Risk.        ATP III CLASSIFICATION (LDL):  <100     mg/dL   Optimal  100-129  mg/dL   Near or Above                    Optimal  130-159  mg/dL   Borderline  160-189  mg/dL   High  >190     mg/dL   Very High    Mr Angiogram Neck W Or Wo Contrast  Result Date: 10/17/2016 CLINICAL DATA:  Initial evaluation for slurred speech with myoclonic jerks. EXAM: MRA NECK WITHOUT AND WITH CONTRAST TECHNIQUE: Multiplanar and multiecho pulse sequences of the neck were obtained without and with intravenous contrast. Angiographic images of the neck were obtained using MRA technique without and with intravenous contrast. CONTRAST:  10m MULTIHANCE GADOBENATE DIMEGLUMINE 529 MG/ML IV SOLN COMPARISON:  None available. FINDINGS: Study is moderately degraded by motion artifact. Partially visualized aortic arch of normal caliber with normal 3 vessel morphology. No high-grade stenosis seen about the origin of the great vessels. Visualized subclavian arteries are widely patent.  Right common carotid artery patent from its origin to the bifurcation. No significant atheromatous stenosis about the right bifurcation/proximal right ICA. Right internal carotid artery patent distally to the circle of Willis. No flow-limiting stenosis, dissection, or vascular occlusion within the right carotid artery system. Left common carotid artery patent from its origin to the bifurcation. No significant atheromatous narrowing about the left bifurcation. Left ICA patent distally to the circle of Willis. No stenosis, dissection, or vascular occlusion within the left carotid artery system. Both vertebral arteries arise from the subclavian arteries. Vertebral arteries widely patent within the neck without flow-limiting stenosis, dissection, or occlusion. IMPRESSION: 1. Mildly limited study due to motion artifact. 2. Normal MRA of the neck. No high-grade or flow-limiting stenosis identified. Electronically Signed   By: BJeannine BogaM.D.   On: 10/17/2016 02:31   Mr Brain Wo Contrast  Result Date: 10/17/2016 CLINICAL DATA:  Initial evaluation for slurred speech, myoclonic jerks. EXAM: MRI HEAD WITHOUT CONTRAST TECHNIQUE: Multiplanar, multiecho pulse sequences of the brain and surrounding structures were obtained without intravenous contrast. COMPARISON:  Prior CT from 10/16/2016. FINDINGS: Brain: Study moderately degraded by motion artifact. Generalized age-related cerebral atrophy. Minimal T2/FLAIR hyperintensity within the periventricular white matter and pons, likely related to chronic small vessel ischemic changes, felt to be within normal limits for age. No abnormal foci of restricted diffusion to suggest acute or subacute ischemia. Gray-white matter differentiation maintained. No areas of chronic infarction identified. No acute or chronic intracranial hemorrhage. No mass lesion, midline shift or mass effect. No hydrocephalus. No extra-axial fluid collection. Major dural sinuses are grossly patent.  Pituitary gland suprasellar region within normal limits. Vascular: Major intracranial vascular flow voids are maintained. Skull and upper cervical spine: Craniocervical junction within normal limits. Visualized upper cervical spine unremarkable. Bone marrow signal intensity within normal limits. No scalp soft tissue abnormality. Sinuses/Orbits: Globes and orbital soft tissues within normal limits. Mild scattered mucosal thickening within the ethmoidal air cells and maxillary sinuses. Paranasal sinuses are otherwise clear. Small right mastoid effusion noted. Inner ear structures normal. Other:  None. IMPRESSION: Normal brain MRI for age. No acute intracranial infarct or other process identified. Electronically Signed   By: Jeannine Boga M.D.   On: 10/17/2016 02:13   Ct Head Code Stroke W/o Cm  Result Date: 10/16/2016 CLINICAL DATA:  Code stroke. Initial evaluation for acute slurred speech. EXAM: CT HEAD WITHOUT CONTRAST TECHNIQUE: Contiguous axial images were obtained from the base of the skull through the vertex without intravenous contrast. COMPARISON:  None available. FINDINGS: Brain: Generalized age-related cerebral atrophy. No acute intracranial hemorrhage. No evidence for acute large vessel territory infarct. No mass lesion, midline shift or mass effect. No hydrocephalus. No extra-axial fluid collection. Subcentimeter calcification noted at the inferior aspect of the fourth ventricle. Vascular: No hyperdense vessel. Skull: Scalp soft tissues within normal limits.  Calvarium intact. Sinuses/Orbits: Globes and oval soft tissues within normal limits. Paranasal sinuses are clear. No mastoid effusion. Other: None. ASPECTS [facility] Stroke Program Early CT Score) - Ganglionic level infarction (caudate, lentiform nuclei, internal capsule, insula, M1-M3 cortex): 7 - Supraganglionic infarction (M4-M6 cortex): 3 Total score (0-10 with 10 being normal): 10 IMPRESSION: 1. No acute intracranial infarct or other  process identified. 2. ASPECTS is 10 Critical Value/emergent results were called by telephone at the time of interpretation on 10/16/2016 at 10:35 pm to Dr. Larae Grooms , who verbally acknowledged these results. Electronically Signed   By: Jeannine Boga M.D.   On: 10/16/2016 22:38    Review of Systems  Constitutional: Negative for chills and fever.  HENT: Negative for sore throat and tinnitus.   Eyes: Negative for blurred vision and redness.  Respiratory: Negative for cough and shortness of breath.   Cardiovascular: Negative for chest pain, palpitations, orthopnea and PND.  Gastrointestinal: Negative for abdominal pain, diarrhea, nausea and vomiting.  Genitourinary: Negative for dysuria, frequency and urgency.  Musculoskeletal: Negative for joint pain and myalgias.  Skin: Negative for rash.       No lesions  Neurological: Positive for tremors (Resolved), sensory change and speech change. Negative for focal weakness and weakness.  Endo/Heme/Allergies: Does not bruise/bleed easily.       No temperature intolerance  Psychiatric/Behavioral: Negative for depression and suicidal ideas.    Blood pressure 139/89, pulse 73, temperature 97.6 F (36.4 C), temperature source Oral, resp. rate 13, height 6' (1.829 m), weight 82.6 kg (182 lb), SpO2 100 %. Physical Exam  Nursing note and vitals reviewed. Constitutional: He is oriented to person, place, and time. He appears well-developed and well-nourished. No distress.  HENT:  Head: Normocephalic and atraumatic.  Mouth/Throat: Oropharynx is clear and moist.  Eyes: Conjunctivae and EOM are normal. Pupils are equal, round, and reactive to light. No scleral icterus.  Neck: Normal range of motion. Neck supple. No JVD present. Carotid bruit is not present. No tracheal deviation present. No thyromegaly present.  Cardiovascular: Normal rate, regular rhythm and normal heart sounds.  Exam reveals no gallop and no friction rub.   No murmur  heard. Respiratory: Effort normal and breath sounds normal. No respiratory distress.  GI: Soft. Bowel sounds are normal. He exhibits no distension. There is no tenderness.  Genitourinary:  Genitourinary Comments: Deferred  Musculoskeletal: Normal range of motion. He exhibits no edema.  Lymphadenopathy:    He has no cervical adenopathy.  Neurological: He is oriented to person, place, and time. He has normal reflexes. No cranial nerve deficit. Coordination (Mild dysmetria) and gait (Left foot drop (chronic)) abnormal.  Patient is still groggy but easily arousable  Skin: Skin is warm  and dry. No rash noted. No erythema.  Psychiatric: He has a normal mood and affect. His behavior is normal. Judgment and thought content normal.     Assessment/Plan This is a 66 year old male admitted for dysarthria. 1. Dysarthria: Concerning for stroke although imaging is negative. Associated numbness to the right side of his face may be secondary to radiation damage following treatment of his oral cancer. 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 Complete stroke workup with echocardiogram and carotid Doppler imaging. Neurology is consulted. I started the patient on aspirin. 2. Orthostatic hypotension: The patient apparently has had issues with this in the past as he is on Midodrine and Florinef. Sudden decrease in blood flow to the head may have caused some of the symptoms we are currently seeing. Hydrate with intravenous fluid. Check orthostatic blood pressure when patient is stable to stand. 3. Hyperlipidemia: Continue statin therapy reduce Risk of cerebrovascular accident 4. Depression: Continue Zoloft and Seroquel 5. DVT prophylaxis: Lovenox 6. GI prophylaxis: None The patient is a full code. Time spent on admission orders and patient care approximately 45 minutes  Harrie Foreman, MD 10/17/2016, 7:28 AM

## 2016-10-17 NOTE — ED Notes (Signed)
Dr Marcille Blanco in to see pt regarding admission

## 2016-10-17 NOTE — ED Notes (Signed)
Pt to MRI via stretcher accomp by EDT Marcie Bal

## 2016-10-17 NOTE — Progress Notes (Signed)
OT Cancellation Note  Patient Details Name: Ronnie Strickland MRN: [id number] DOB: 1951-02-23   Cancelled Treatment:    Reason Eval/Treat Not Completed: Patient declined, no reason specified Order received and chart reviewed.  Pt sound asleep and his wife requested OT eval later today or tomorrow. Will attempt later today or tomorrow. Thank you for the referral.  Chrys Racer, OTR/L ascom [phone] 10/17/16, 10:58 AM

## 2016-10-17 NOTE — Evaluation (Signed)
Physical Therapy Evaluation Patient Details Name: Ronnie Strickland MRN: [id number] DOB: 09-12-1950 Today's Date: 10/17/2016   History of Present Illness  66 y/o male here after episode of tremors, b/l U&LE numbness and general weakness.  He is still having some dysarthria and this remains his biggest concern.  Pt with history of oral cancer with radiation, CT and MRI (-) for CVA.  Clinical Impression  Pt did well with PT exam and though he did not show significant safety issues during ambulation and mobility he has had very regular falling and PT spent at least 10 minutes discussing AD use, balance training, cues during ambulation and mobility and general education to insure he is using the appropriate devices and taking the appropriate precautions with every day mobility to minimize risk of falling, etc. He did well with PT exam and gait training and will have regular assistance at home, but is home alone for multiple hour stretches; HHPT would be beneficial for him to work on balance and address regular falling issues.     Follow Up Recommendations Home health PT    Equipment Recommendations       Recommendations for Other Services       Precautions / Restrictions Precautions Precautions: Fall Restrictions Weight Bearing Restrictions: No      Mobility  Bed Mobility Overal bed mobility: Independent                Transfers Overall transfer level: Independent Equipment used: Rolling walker (2 wheeled)             General transfer comment: Pt able to get to standing w/o assist, showed good confidence and balance  Ambulation/Gait Ambulation/Gait assistance: Supervision Ambulation Distance (Feet): 300 Feet Assistive device: Rolling walker (2 wheeled);None       General Gait Details: Pt did well with ambulation showing only one small stagger steps and no overt buckling (apparently something that often happens when he falls).  He walked ~200 ft with AD and ~100 ft w/o AD.   He was safe, had very little fatigue and ultimately did well with ambulation.  Stairs Stairs: Yes Stairs assistance: Supervision Stair Management: One rail Right Number of Stairs: 5 General stair comments: Pt negotiated up/down steps with reciprocal pattern and good confidence/safety using single rail  Wheelchair Mobility    Modified Rankin (Stroke Patients Only)       Balance Overall balance assessment: Modified Independent                                           Pertinent Vitals/Pain Pain Assessment: No/denies pain    Home Living Family/patient expects to be discharged to:: Private residence Living Arrangements: Spouse/significant other;Other relatives Available Help at Discharge: Available PRN/intermittently (pt typically home 4-6 hours w/o assist during the day)   Home Access: Stairs to enter Entrance Stairs-Rails: Left;Right Entrance Stairs-Number of Steps: 5 Home Layout: One level Home Equipment: Walker - 2 wheels;Cane - single point Additional Comments: Pt inconsistent with AD use    Prior Function Level of Independence: Independent         Comments: Pt has had "about 30" falls in the last 6 months.  Inconsistent with mobility and safety.     Hand Dominance        Extremity/Trunk Assessment   Upper Extremity Assessment Upper Extremity Assessment: Overall WFL for tasks assessed    Lower Extremity Assessment  Lower Extremity Assessment: Overall WFL for tasks assessed (L ankle with very little AROM, otherwise L LE grossly 4-/5 )       Communication   Communication:  (difficulty with word formation/slurred speech)  Cognition Arousal/Alertness: Awake/alert Behavior During Therapy: WFL for tasks assessed/performed Overall Cognitive Status: Within Functional Limits for tasks assessed                                        General Comments      Exercises     Assessment/Plan    PT Assessment Patient needs  continued PT services  PT Problem List Decreased strength;Decreased range of motion;Decreased activity tolerance;Decreased balance;Decreased coordination;Decreased safety awareness;Decreased knowledge of use of DME;Decreased mobility       PT Treatment Interventions DME instruction;Gait training;Stair training;Functional mobility training;Therapeutic activities;Therapeutic exercise;Balance training;Neuromuscular re-education;Cognitive remediation;Patient/family education    PT Goals (Current goals can be found in the Care Plan section)  Acute Rehab PT Goals Patient Stated Goal: go home PT Goal Formulation: With patient Time For Goal Achievement: 10/31/16 Potential to Achieve Goals: Good    Frequency Min 2X/week   Barriers to discharge        Co-evaluation               End of Session Equipment Utilized During Treatment: Gait belt Activity Tolerance: Patient tolerated treatment well Patient left: with chair alarm set;with call bell/phone within reach;with family/visitor present   PT Visit Diagnosis: Unsteadiness on feet (R26.81);Difficulty in walking, not elsewhere classified (R26.2);Repeated falls (R29.6);Muscle weakness (generalized) (M62.81)    Time: 0762-2633 PT Time Calculation (min) (ACUTE ONLY): 31 min   Charges:   PT Evaluation $PT Eval Low Complexity: 1 Procedure PT Treatments $Gait Training: 8-22 mins   PT G Codes:   PT G-Codes **NOT FOR INPATIENT CLASS** Functional Assessment Tool Used: Clinical judgement Functional Limitation: Mobility: Walking and moving around Mobility: Walking and Moving Around Current Status (H5456): At least 1 percent but less than 20 percent impaired, limited or restricted Mobility: Walking and Moving Around Goal Status [phone]): 0 percent impaired, limited or restricted    Kreg Shropshire, DPT 10/17/2016, 11:43 AM

## 2016-10-17 NOTE — Progress Notes (Signed)
Pt arrived from ED alert and oriented. Skin and telemetry verified, no SOB. Pt c/o chronic jaw pain 8/10. Stroke scale and neuro check done, no new findings. Pt has some slurred speech but can be understood. Minimal delay in replies. Will continue to monitor.

## 2016-10-17 NOTE — ED Notes (Addendum)
Pt returns to room; resting quietly with no distress, no c/o; placed back on card monitor; neurocheck remains unchanged; no further leg restlessness noted

## 2016-10-17 NOTE — ED Notes (Addendum)
Pt's wife & daughter has all pt belongings to take home with them at this time; pt has his own black compression socks in place and cell phone; slipper socks placed on pt

## 2016-10-17 NOTE — Progress Notes (Signed)
Spoke with Dr. Benjie Karvonen - she has already spoken to Dr. Doy Mince. Patient can be discharged, does not need to wait for echo to be read. Aware of orthostatics. Instructed to educated patient on orthostatic hypotension (printed handout and discussion) and add to d/c papers that patient can increase florinef to twice a day if he continues to feel symptomatic with position changed (until he can see his PCP for further med adjustments).

## 2016-10-17 NOTE — Progress Notes (Signed)
Patient seen and examined this morning. 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 Hyperlipidemia: Continue statin therapy

## 2016-10-17 NOTE — Evaluation (Signed)
Clinical/Bedside Swallow Evaluation Patient Details  Name: Ronnie Strickland MRN: [id number] Date of Birth: July 07, 1950  Today's Date: 10/17/2016 Time: SLP Start Time (ACUTE ONLY): 1100 SLP Stop Time (ACUTE ONLY): 1200 SLP Time Calculation (min) (ACUTE ONLY): 60 min  Past Medical History:  Past Medical History:  Diagnosis Date  . Diabetes mellitus type 2, diet-controlled ([place])   . GERD (gastroesophageal reflux disease)   . Gout   . H/O knee surgery   . Hyperlipidemia   . Oral cancer [facility])    Past Surgical History:  Past Surgical History:  Procedure Laterality Date  . COLONOSCOPY    . COLONOSCOPY WITH PROPOFOL N/A 03/21/2016   Procedure: COLONOSCOPY WITH PROPOFOL;  Surgeon: Lucilla Lame, MD;  Location: [place];  Service: Endoscopy;  Laterality: N/A;  . ESOPHAGOGASTRODUODENOSCOPY (EGD) WITH PROPOFOL N/A 03/21/2016   Procedure: ESOPHAGOGASTRODUODENOSCOPY (EGD) WITH PROPOFOL;  Surgeon: Lucilla Lame, MD;  Location: [place];  Service: Endoscopy;  Laterality: N/A;  diabetic - diet controlled  . KNEE SURGERY    . VASECTOMY     HPI:  The patient is a 66 y/o male w/ past medical history of oral cancer w/ XRTs, GERD, and diabetes presents to the emergency department after an episode of tremulousness and weakness. The patient's family arrived at home to find him with dysarthric speech. The patient mentioned at that time that the right side of his face felt numb and he complained of tingling in his arms and legs bilaterally. He rose to stand but nearly fell due to weakness. His wife walked him to his bedroom where he then had an episode of tremors during which she was conscious and responsive. He did not lose continence of bowel or bladder but afterwards felt very very weak. In the emergency department CT and MRI of the patient's brain showed no acute intracranial abnormalities. 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 Pt stated he is abel to drink liquids and eat "softer " foods "better" but does eat a hotdog cut up into small pieces. He endorses pain and discomfort when chewing and swallowing; even when moving his tongue around in his mouth during ROM exercises/positions - on the RIGHT side. Of note, the Right side of his neck appeared enlarged/edematous and was tender to touch per pt report.    Assessment / Plan / Recommendation Clinical Impression  Pt appears to exhibit a functional swallow w/ no gross oropharyngeal phase dysphagia and is able to adequately manage a modified diet (solids) of mech soft foods and thin liquids VIA CUP w/ no gross, overt s/s of aspiration noted. However, pt does endorse moderate discomfort and pain on the RIGHT side of mandible/throat/lingual areas during the oropharyngeal phases of swallowing. Recommend pt f/u w/ ENT and possible prior MDs involved in his oral surgery d/t his discomfort/pain; pt also indicated increased edema in the same area. Recommended a soft foods diet(Mech soft) that are cut small and cooked well; all foods moistened well. Recommend small, single sips of liquids VIA CUP to aid bolus control during oral management/swallowing. Recommend general aspiration precautions; Pills WHOLE in Puree for easier, safer swallowing. MD/NSG updated.  SLP Visit Diagnosis: Dysphagia, oropharyngeal phase (R13.12)    Aspiration Risk  Mild aspiration risk (d/t associated discomfort/pain)    Diet Recommendation  Mech Soft diet(foods moistened well; cut  small); Thin liquids - No straws. General aspiration precautions.  Medication Administration: Whole meds with puree (as needed for easier swallowing)    Other  Recommendations Recommended Consults: Consider ENT evaluation Oral Care Recommendations: Oral care BID;Patient independent with oral care   Follow up  Recommendations None      Frequency and Duration            Prognosis Prognosis for Safe Diet Advancement: Good Barriers to Reach Goals:  (previous oral cancer; XRT)      Swallow Study   General Date of Onset: 10/16/16 HPI: The patient is a 66 y/o male w/ past medical history of oral cancer w/ XRTs, GERD, and diabetes presents to the emergency department after an episode of tremulousness and weakness. The patient's family arrived at home to find him with dysarthric speech. The patient mentioned at that time that the right side of his face felt numb and he complained of tingling in his arms and legs bilaterally. He rose to stand but nearly fell due to weakness. His wife walked him to his bedroom where he then had an episode of tremors during which she was conscious and responsive. He did not lose continence of bowel or bladder but afterwards felt very very weak. In the emergency department CT and MRI of the patient's brain showed no acute intracranial abnormalities. 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 Pt stated he is abel to drink liquids and eat "softer " foods "better" but does eat a hotdog cut up into small pieces. He endorses pain and discomfort when chewing and swallowing; even when moving his tongue around in his mouth during ROM exercises/positions - on the RIGHT side. Of note, the Right side of his neck appeared enlarged/edematous and was tender to touch per pt report.  Type of Study: Bedside Swallow Evaluation Previous Swallow Assessment: esophageal dysmotility in 12/2015(GI); difficulty w/ masticating since the oral surgery for the tumor "a couple of years now" Diet Prior to this Study: Dysphagia 3 (soft);Regular;Thin liquids Temperature Spikes Noted: No (wbc 3.7(low)) Respiratory Status: Room  air History of Recent Intubation: No Behavior/Cognition: Alert;Cooperative;Pleasant mood Oral Cavity Assessment: Dry (min) Oral Care Completed by SLP: Recent completion by staff Oral Cavity - Dentition: Adequate natural dentition Vision: Functional for self-feeding Self-Feeding Abilities: Able to feed self Patient Positioning: Upright in bed Baseline Vocal Quality: Normal (clear) Volitional Cough: Strong Volitional Swallow: Able to elicit    Oral/Motor/Sensory Function Overall Oral Motor/Sensory Function: Mild impairment Facial ROM: Within Functional Limits Facial Symmetry: Within Functional Limits Facial Strength: Within Functional Limits Lingual ROM: Within Functional Limits (painful to reach to his Left) Lingual Symmetry: Abnormal symmetry right Lingual Strength: Reduced (min on Right) Velum: Within Functional Limits Mandible: Within Functional Limits   Ice Chips Ice chips: Within functional limits Presentation: Spoon (3 trials)   Thin Liquid Thin Liquid: Within functional limits Presentation: Cup;Self Fed (~4 ozs) Other Comments: delayed throat clear x1 - mild. Did not appear directly related to the trials but pt did state he may have taken too large of a sip.    Nectar Thick Nectar Thick Liquid: Not tested   Honey Thick Honey Thick Liquid: Not tested   Puree Puree: Within functional limits Presentation: Self Fed;Spoon (3 ozs)   Solid   GO   Solid: Impaired Presentation: Spoon;Self Fed (3  trials) Oral Phase Impairments: Impaired mastication (chews on his Left side; discomfort on his Right side) Oral Phase Functional Implications: Impaired mastication Pharyngeal Phase Impairments:  (none) Other Comments: best w/ mech soft foods    Functional Assessment Tool Used: clinical judgement Functional Limitations: Swallowing Swallow Current Status (W2956): At least 1 percent but less than 20 percent impaired, limited or restricted Swallow Goal Status [phone]): At least 1  percent but less than 20 percent impaired, limited or restricted Swallow Discharge Status [phone]): At least 1 percent but less than 20 percent impaired, limited or restricted     Orinda Kenner, MS, CCC-SLP Watson,Katherine 10/17/2016,2:42 PM

## 2016-10-17 NOTE — Progress Notes (Addendum)
Per Dr. Benjie Karvonen, patient can be discharged once cleared by Neuro - she has already spoken to Dr. Doy Mince. Pt states he prefers to wait to see if he will be discharged before he takes daily meds (diet order in by SLP).

## 2016-10-17 NOTE — Evaluation (Signed)
Occupational Therapy Evaluation Patient Details Name: Ronnie Strickland MRN: [id number] DOB: [birth year] Today's Date: 10/17/2016    History of Present Illness 66 y/o male here after episode of tremors, b/l U&LE numbness and general weakness.  He is still having some dysarthria and this remains his biggest concern.  Pt with history of oral cancer with radiation, CT and MRI (-) for CVA.   Clinical Impression   Pt is 66 year old male who presents with new onset of dysarthria, bilateral UE and LE numbness and general weakness with hx of oral cancer with radiation.  CT and MRI negative for any infarcts. Pt is eager to go home and is in room with his wife and grand daughter.  He is at baseline for ADLs and completed dressing skills sitting EOB with supervision only.  When putting his shoes on, he held onto the railing with mild sway but no LOB.  He ambulated to bathroom with supervision only as well and no assist and no LOB.  Pt is not in need of any furher OT services, but rec supervision at all times at home to prevent falls.  Discussed rec for [facility].  Also rec installing a night light so he can see better when ambulating from the bedroom to the bathroom at night to also prevent falls.  No further OT recommended and patient seen for OT eval only.      Follow Up Recommendations  No OT follow up    Equipment Recommendations  Other (comment) (rec grab bars in bathroom by toilet and shower to help prevent falls and a night light in bedroom to light path to bathroom )    Recommendations for Other Services       Precautions / Restrictions Precautions Precautions: Fall Restrictions Weight Bearing Restrictions: No      Mobility Bed Mobility                  Transfers                      Balance                                           ADL either performed or assessed with clinical judgement   ADL Overall  ADL's : At baseline                                       General ADL Comments: Rec supervision for safety based on hx of 30+ falls.  Pt is slightly impulsive and uses humor to compensate.  Also rec grab bars in bathroom and a night light so he can see better when getting up to go to the bathroom in the middle of the night.     Vision         Perception     Praxis      Pertinent Vitals/Pain Pain Assessment: 0-10 Pain Score: 3  Pain Location: R jaw area Pain Descriptors / Indicators: Discomfort;Tender;Other (Comment) (pain when chewing or opening mouth wide) Pain Intervention(s): Limited activity within patient's tolerance;Monitored during session     Hand Dominance Right   Extremity/Trunk Assessment Upper Extremity Assessment Upper Extremity Assessment: Overall WFL for tasks assessed   Lower Extremity  Assessment Lower Extremity Assessment: Defer to PT evaluation       Communication Communication Communication: No difficulties   Cognition Arousal/Alertness: Awake/alert Behavior During Therapy: WFL for tasks assessed/performed Overall Cognitive Status: Within Functional Limits for tasks assessed                                     General Comments       Exercises     Shoulder Instructions      Home Living Family/patient expects to be discharged to:: Private residence Living Arrangements: Spouse/significant other;Other relatives Available Help at Discharge: Available PRN/intermittently (family is working on having supervision at all times)   Home Access: Stairs to enter Technical brewer of Steps: 5 Entrance Stairs-Rails: Left;Right Home Layout: One level     Bathroom Shower/Tub: Walk-in Hydrologist: Standard     Home Equipment: Environmental consultant - 2 wheels;Cane - single point;Shower seat   Additional Comments: Pt inconsistent with AD use      Prior Functioning/Environment Level of Independence:  Independent        Comments: Pt has had "about 30" falls in the last 6 months.  Inconsistent with mobility and safety. [facility] in shower and by toilet after he gets home.        OT Problem List:        OT Treatment/Interventions:      OT Goals(Current goals can be found in the care plan section) Acute Rehab OT Goals Patient Stated Goal: go home  OT Frequency:     Barriers to D/C:            Co-evaluation              End of Session    Activity Tolerance: Patient tolerated treatment well Patient left: with family/visitor present;Other (comment) (pt standing after removing leads for telemetry monitor and NSG arrived to remove IV and go over DC paperwork)  OT Visit Diagnosis: Unsteadiness on feet (R26.81)                Time: 1435-1510 OT Time Calculation (min): 35 min Charges:  OT General Charges $OT Visit: 1 Procedure OT Evaluation $OT Eval Low Complexity: 1 Procedure OT Treatments $Self Care/Home Management : 8-22 mins G-Codes: OT G-codes **NOT FOR INPATIENT CLASS** Functional Limitation: Self care Self Care Current Status (O8325): At least 1 percent but less than 20 percent impaired, limited or restricted Self Care Goal Status (Q9826): At least 1 percent but less than 20 percent impaired, limited or restricted   Chrys Racer, OTR/L ascom [phone] 10/17/16, 3:46 PM

## 2016-10-17 NOTE — Care Management Obs Status (Signed)
[facility] NOTIFICATION   Patient Details  Name: Joseluis Alessio MRN: [id number] Date of Birth: [birth year]   Medicare Observation Status Notification Given:  Yes    Katrina Stack, RN 10/17/2016, 10:17 AM

## 2016-10-17 NOTE — Discharge Summary (Signed)
Ronnie Strickland at [place] NAME: Ronnie Strickland    MR#:  [id number]  DATE OF BIRTH:  Apr 23, 1951  DATE OF ADMISSION:  10/16/2016 ADMITTING PHYSICIAN: Harrie Foreman, MD  DATE OF DISCHARGE: 10/17/2016  PRIMARY CARE PHYSICIAN: Ronnie Strickland    ADMISSION DIAGNOSIS:  Paresthesia [R20.2] Myoclonic jerking [G25.3] Stroke (cerebrum) (HCC) [I63.9]  DISCHARGE DIAGNOSIS:  Active Problems:   Dysarthria   SECONDARY DIAGNOSIS:   Past Medical History:  Diagnosis Date  . Diabetes mellitus type 2, diet-controlled ([place])   . GERD (gastroesophageal reflux disease)   . Gout   . H/O knee surgery   . Hyperlipidemia   . Oral cancer [facility])     HOSPITAL COURSE:   66 y/o male with oral Cancer status post radiation therapy presented dysarthria  1. Dysarthria: Patient underwent CVA workup which wwas all normal. This is from radiation therapy. Speech has recommended outpatient follow up with ENT and Dysphagia 3 diet. Neurology was also consulted and felt that his symptoms were not consistent with TIA/CVA.  2. Orthostatic Hypotension: Continue Midodrine and Florinef. These medications may need to be titrated if patient continues to have orthostasis.  3. HLD: Continue statin 4. Depression; continue Zoloft and Seroquel  5. Oral cancer: He will follow up with his Oncologist  DISCHARGE CONDITIONS AND DIET:  Stable Dysphagia 3 diet  CONSULTS OBTAINED:  Treatment Team:  Alexis Goodell, MD  DRUG ALLERGIES:   Allergies  Allergen Reactions  . Tomato Hives    DISCHARGE MEDICATIONS:   Current Discharge Medication List    CONTINUE these medications which have NOT CHANGED   Details  ARTIFICIAL SALIVA MT Use as directed 1 application in the mouth or throat every 4 (four) hours as needed (dry mouth).    atorvastatin (LIPITOR) 80 MG tablet Take 40 mg by mouth daily.     Cholecalciferol (VITAMIN D3) 1000 units CAPS Take 2,000 Units by mouth  daily.     diclofenac sodium (VOLTAREN) 1 % GEL Apply 4 g topically 3 (three) times daily as needed (pain).    fludrocortisone (FLORINEF) 0.1 MG tablet Take 0.1 mg by mouth daily.    latanoprost (XALATAN) 0.005 % ophthalmic solution Place 1 drop into both eyes at bedtime.    midodrine (PROAMATINE) 5 MG tablet Take 10 mg by mouth 3 (three) times daily with meals.    nystatin (MYCOSTATIN) 100000 UNIT/ML suspension Take 10 mLs by mouth 2 (two) times daily. Swish and swallow    omeprazole (PRILOSEC) 20 MG capsule Take 20 mg by mouth 2 (two) times daily before a meal.    oxyCODONE (OXY IR/ROXICODONE) 5 MG immediate release tablet Take 5 mg by mouth every 6 (six) hours as needed for severe pain.    QUEtiapine (SEROQUEL) 100 MG tablet Take 100 mg by mouth at bedtime. Take 100mg  at bedtime for 14 days. Then take 50mg  at bedtime for 7 days. Then stop.    sertraline (ZOLOFT) 100 MG tablet Take 200 mg by mouth daily.       STOP taking these medications     lidocaine (XYLOCAINE) 2 % solution           Today   CHIEF COMPLAINT:  Doing well no neurological defecits   VITAL SIGNS:  Blood pressure (!) 149/97, pulse 79, temperature 98.1 F (36.7 C), resp. rate 18, height 6' (1.829 m), weight 82.6 kg (182 lb), SpO2 99 %.   REVIEW OF SYSTEMS:  Review of Systems  Constitutional: Negative.  Negative for chills, fever and malaise/fatigue.  HENT: Negative for ear discharge, ear pain, hearing loss, nosebleeds and sore throat.        Jaw pain  Eyes: Negative.  Negative for blurred vision and pain.  Respiratory: Negative.  Negative for cough, hemoptysis, shortness of breath and wheezing.   Cardiovascular: Negative.  Negative for chest pain, palpitations and leg swelling.  Gastrointestinal: Negative.  Negative for abdominal pain, blood in stool, diarrhea, nausea and vomiting.  Genitourinary: Negative.  Negative for dysuria.  Musculoskeletal: Negative.  Negative for back pain.  Skin:  Negative.   Neurological: Negative for dizziness, tremors, speech change, focal weakness, seizures and headaches.  Endo/Heme/Allergies: Negative.  Does not bruise/bleed easily.  Psychiatric/Behavioral: Negative.  Negative for depression, hallucinations and suicidal ideas.     PHYSICAL EXAMINATION:  GENERAL:  66 y.o.-year-old patient lying in the bed with no acute distress.  NECK:  Supple, no jugular venous distention. No thyroid enlargement, no tenderness.  LUNGS: Normal breath sounds bilaterally, no wheezing, rales,rhonchi  No use of accessory muscles of respiration.  CARDIOVASCULAR: S1, S2 normal. No murmurs, rubs, or gallops.  ABDOMEN: Soft, non-tender, non-distended. Bowel sounds present. No organomegaly or mass.  EXTREMITIES: No pedal edema, cyanosis, or clubbing.  PSYCHIATRIC: The patient is alert and oriented x 3.  SKIN: No obvious rash, lesion, or ulcer.   DATA REVIEW:   CBC  Recent Labs Lab 10/16/16 2206  WBC 3.7*  HGB 12.4*  HCT 36.7*  PLT 92*    Chemistries   Recent Labs Lab 10/16/16 2206  NA 140  K 4.2  CL 106  CO2 30  GLUCOSE 95  BUN 26*  CREATININE 1.19  CALCIUM 8.7*  AST 19  ALT 18  ALKPHOS 78  BILITOT 0.5    Cardiac Enzymes  Recent Labs Lab 10/16/16 2206  TROPONINI <0.03    Microbiology Results  @[email or web address] @  RADIOLOGY:  Mr Angiogram Neck W Or Wo Contrast  Result Date: 10/17/2016 CLINICAL DATA:  Initial evaluation for slurred speech with myoclonic jerks. EXAM: MRA NECK WITHOUT AND WITH CONTRAST TECHNIQUE: Multiplanar and multiecho pulse sequences of the neck were obtained without and with intravenous contrast. Angiographic images of the neck were obtained using MRA technique without and with intravenous contrast. CONTRAST:  52mL MULTIHANCE GADOBENATE DIMEGLUMINE 529 MG/ML IV SOLN COMPARISON:  None available. FINDINGS: Study is moderately degraded by motion artifact. Partially visualized aortic arch of normal caliber with normal 3 vessel  morphology. No high-grade stenosis seen about the origin of the great vessels. Visualized subclavian arteries are widely patent. Right common carotid artery patent from its origin to the bifurcation. No significant atheromatous stenosis about the right bifurcation/proximal right ICA. Right internal carotid artery patent distally to the circle of Willis. No flow-limiting stenosis, dissection, or vascular occlusion within the right carotid artery system. Left common carotid artery patent from its origin to the bifurcation. No significant atheromatous narrowing about the left bifurcation. Left ICA patent distally to the circle of Willis. No stenosis, dissection, or vascular occlusion within the left carotid artery system. Both vertebral arteries arise from the subclavian arteries. Vertebral arteries widely patent within the neck without flow-limiting stenosis, dissection, or occlusion. IMPRESSION: 1. Mildly limited study due to motion artifact. 2. Normal MRA of the neck. No high-grade or flow-limiting stenosis identified. Electronically Signed   By: Jeannine Boga M.D.   On: 10/17/2016 02:31   Mr Brain Wo Contrast  Result Date: 10/17/2016 CLINICAL DATA:  Initial evaluation for slurred speech,  myoclonic jerks. EXAM: MRI HEAD WITHOUT CONTRAST TECHNIQUE: Multiplanar, multiecho pulse sequences of the brain and surrounding structures were obtained without intravenous contrast. COMPARISON:  Prior CT from 10/16/2016. FINDINGS: Brain: Study moderately degraded by motion artifact. Generalized age-related cerebral atrophy. Minimal T2/FLAIR hyperintensity within the periventricular white matter and pons, likely related to chronic small vessel ischemic changes, felt to be within normal limits for age. No abnormal foci of restricted diffusion to suggest acute or subacute ischemia. Gray-white matter differentiation maintained. No areas of chronic infarction identified. No acute or chronic intracranial hemorrhage. No mass  lesion, midline shift or mass effect. No hydrocephalus. No extra-axial fluid collection. Major dural sinuses are grossly patent. Pituitary gland suprasellar region within normal limits. Vascular: Major intracranial vascular flow voids are maintained. Skull and upper cervical spine: Craniocervical junction within normal limits. Visualized upper cervical spine unremarkable. Bone marrow signal intensity within normal limits. No scalp soft tissue abnormality. Sinuses/Orbits: Globes and orbital soft tissues within normal limits. Mild scattered mucosal thickening within the ethmoidal air cells and maxillary sinuses. Paranasal sinuses are otherwise clear. Small right mastoid effusion noted. Inner ear structures normal. Other: None. IMPRESSION: Normal brain MRI for age. No acute intracranial infarct or other process identified. Electronically Signed   By: Jeannine Boga M.D.   On: 10/17/2016 02:13   US Carotid Bilateral (at [facility] Only)  Result Date: 10/17/2016 CLINICAL DATA:  Stroke. EXAM: BILATERAL CAROTID DUPLEX ULTRASOUND TECHNIQUE: Pearline Cables scale imaging, color Doppler and duplex ultrasound were performed of bilateral carotid and vertebral arteries in the neck. COMPARISON:  10/16/2016 . FINDINGS: Criteria: Quantification of carotid stenosis is based on velocity parameters that correlate the residual internal carotid diameter with NASCET-based stenosis levels, using the diameter of the distal internal carotid lumen as the denominator for stenosis measurement. The following velocity measurements were obtained: RIGHT ICA:  71/31 cm/sec CCA:  99/24 cm/sec SYSTOLIC ICA/CCA RATIO:  0.9 DIASTOLIC ICA/CCA RATIO:  1.5 ECA:  63 cm/sec LEFT ICA:  59/28 cm/sec CCA:  26/83 cm/sec SYSTOLIC ICA/CCA RATIO:  1.0 DIASTOLIC ICA/CCA RATIO:  1.6 ECA:  53 cm/sec RIGHT CAROTID ARTERY: No significant carotid atherosclerotic vascular disease. No flow limiting stenosis. RIGHT VERTEBRAL ARTERY:  Patent with antegrade flow. LEFT  CAROTID ARTERY: No significant carotid atherosclerotic vascular disease. No flow limiting stenosis. LEFT VERTEBRAL ARTERY:  Patent with antegrade flow. IMPRESSION: 1. No significant carotid atherosclerotic vascular disease. No flow limiting stenosis. 2. Vertebrals are patent with antegrade flow. Electronically Signed   By: Marcello Moores  Register   On: 10/17/2016 10:46   Ct Head Code Stroke W/o Cm  Result Date: 10/16/2016 CLINICAL DATA:  Code stroke. Initial evaluation for acute slurred speech. EXAM: CT HEAD WITHOUT CONTRAST TECHNIQUE: Contiguous axial images were obtained from the base of the skull through the vertex without intravenous contrast. COMPARISON:  None available. FINDINGS: Brain: Generalized age-related cerebral atrophy. No acute intracranial hemorrhage. No evidence for acute large vessel territory infarct. No mass lesion, midline shift or mass effect. No hydrocephalus. No extra-axial fluid collection. Subcentimeter calcification noted at the inferior aspect of the fourth ventricle. Vascular: No hyperdense vessel. Skull: Scalp soft tissues within normal limits.  Calvarium intact. Sinuses/Orbits: Globes and oval soft tissues within normal limits. Paranasal sinuses are clear. No mastoid effusion. Other: None. ASPECTS [facility] Stroke Program Early CT Score) - Ganglionic level infarction (caudate, lentiform nuclei, internal capsule, insula, M1-M3 cortex): 7 - Supraganglionic infarction (M4-M6 cortex): 3 Total score (0-10 with 10 being normal): 10 IMPRESSION: 1. No acute intracranial infarct or  other process identified. 2. ASPECTS is 10 Critical Value/emergent results were called by telephone at the time of interpretation on 10/16/2016 at 10:35 pm to Dr. Larae Grooms , who verbally acknowledged these results. Electronically Signed   By: Jeannine Boga M.D.   On: 10/16/2016 22:38      Current Discharge Medication List    CONTINUE these medications which have NOT CHANGED   Details  ARTIFICIAL  SALIVA MT Use as directed 1 application in the mouth or throat every 4 (four) hours as needed (dry mouth).    atorvastatin (LIPITOR) 80 MG tablet Take 40 mg by mouth daily.     Cholecalciferol (VITAMIN D3) 1000 units CAPS Take 2,000 Units by mouth daily.     diclofenac sodium (VOLTAREN) 1 % GEL Apply 4 g topically 3 (three) times daily as needed (pain).    fludrocortisone (FLORINEF) 0.1 MG tablet Take 0.1 mg by mouth daily.    latanoprost (XALATAN) 0.005 % ophthalmic solution Place 1 drop into both eyes at bedtime.    midodrine (PROAMATINE) 5 MG tablet Take 10 mg by mouth 3 (three) times daily with meals.    nystatin (MYCOSTATIN) 100000 UNIT/ML suspension Take 10 mLs by mouth 2 (two) times daily. Swish and swallow    omeprazole (PRILOSEC) 20 MG capsule Take 20 mg by mouth 2 (two) times daily before a meal.    oxyCODONE (OXY IR/ROXICODONE) 5 MG immediate release tablet Take 5 mg by mouth every 6 (six) hours as needed for severe pain.    QUEtiapine (SEROQUEL) 100 MG tablet Take 100 mg by mouth at bedtime. Take 100mg  at bedtime for 14 days. Then take 50mg  at bedtime for 7 days. Then stop.    sertraline (ZOLOFT) 100 MG tablet Take 200 mg by mouth daily.       STOP taking these medications     lidocaine (XYLOCAINE) 2 % solution            Management plans discussed with the patient and he is in agreement. Stable for discharge home  Patient should follow up with pcp  CODE STATUS:     Code Status Orders        Start     Ordered   10/17/16 0427  Full code  Continuous     10/17/16 0427    Code Status History    Date Active Date Inactive Code Status Order ID Comments User Context   This patient has a current code status but no historical code status.      TOTAL TIME TAKING CARE OF THIS PATIENT: 37 minutes.    Note: This dictation was prepared with Dragon dictation along with smaller phrase technology. Any transcriptional errors that result from this process are  unintentional.  Shavette Shoaff M.D on 10/17/2016 at 12:09 PM  Between 7am to 6pm - Pager - [phone] After 6pm go to [email or web address] - password [email or web address] [facility]  Office  [phone]  CC: Primary care physician; [facility]

## 2016-10-17 NOTE — Progress Notes (Signed)
*  PRELIMINARY RESULTS* Echocardiogram 2D Echocardiogram has been performed.  Ronnie Strickland 10/17/2016, 3:20 PM

## 2016-10-17 NOTE — ED Notes (Signed)
Dr Owens Shark in to update pt & family on plan of care

## 2016-10-17 NOTE — ED Notes (Signed)
Pt  uprite on stretcher in exam room with no distress noted, watching TV; family at bedside; pt denies any c/o; neurochecks remain unchanged; pt & family voices understanding of plan of care

## 2016-10-17 NOTE — Consult Note (Signed)
Reason for Consult:Slurred speech, shakiness Referring Physician: Mody  CC: Slurred speech and shakiness  HPI: Ronnie Strickland is an 66 y.o. male with a history of oral cancer s/p radiation which ended in February of this year.  Family returned home on yesterday to find patient sitting with slurred speech.  Patient also described tingling throughout body on both sides.  When he attempted to stand he was weak and shaking all over.  There was also some complaint of right sided facial numbness as well.  Patient did not lose consciousness and is able to recall the entire event.  There was no tongue biting or bowel/bladder incontinence.  Patient has improved today.    Past Medical History:  Diagnosis Date  . Diabetes mellitus type 2, diet-controlled ([place])   . GERD (gastroesophageal reflux disease)   . Gout   . H/O knee surgery   . Hyperlipidemia   . Oral cancer [facility])     Past Surgical History:  Procedure Laterality Date  . COLONOSCOPY    . COLONOSCOPY WITH PROPOFOL N/A 03/21/2016   Procedure: COLONOSCOPY WITH PROPOFOL;  Surgeon: Lucilla Lame, MD;  Location: [facility];  Service: Endoscopy;  Laterality: N/A;  . ESOPHAGOGASTRODUODENOSCOPY (EGD) WITH PROPOFOL N/A 03/21/2016   Procedure: ESOPHAGOGASTRODUODENOSCOPY (EGD) WITH PROPOFOL;  Surgeon: Lucilla Lame, MD;  Location: [place];  Service: Endoscopy;  Laterality: N/A;  diabetic - diet controlled  . KNEE SURGERY    . VASECTOMY      Family History  Problem Relation Age of Onset  . Heart disease Mother   . Arthritis Mother     Social History:  reports that he quit smoking about 33 years ago. His smoking use included Cigarettes. He has never used smokeless tobacco. He reports that he drinks alcohol. He reports that he does not use drugs.  Allergies  Allergen Reactions  . Tomato Hives    Medications:  I have reviewed the patient's current medications. Prior to Admission:  Prescriptions Prior to Admission  Medication  Sig Dispense Refill Last Dose  . ARTIFICIAL SALIVA MT Use as directed 1 application in the mouth or throat every 4 (four) hours as needed (dry mouth).   prn at prn  . atorvastatin (LIPITOR) 80 MG tablet Take 40 mg by mouth daily.    10/16/2016 at Unknown time  . Cholecalciferol (VITAMIN D3) 1000 units CAPS Take 2,000 Units by mouth daily.    10/16/2016 at Unknown time  . diclofenac sodium (VOLTAREN) 1 % GEL Apply 4 g topically 3 (three) times daily as needed (pain).   prn at prn  . fludrocortisone (FLORINEF) 0.1 MG tablet Take 0.1 mg by mouth daily.   10/16/2016 at Unknown time  . latanoprost (XALATAN) 0.005 % ophthalmic solution Place 1 drop into both eyes at bedtime.   10/16/2016 at Unknown time  . lidocaine (XYLOCAINE) 2 % solution Use as directed 20 mLs in the mouth or throat 3 (three) times daily as needed for mouth pain.   prn at prn  . midodrine (PROAMATINE) 5 MG tablet Take 10 mg by mouth 3 (three) times daily with meals.   10/16/2016 at 1900  . nystatin (MYCOSTATIN) 100000 UNIT/ML suspension Take 10 mLs by mouth 2 (two) times daily. Swish and swallow   10/16/2016 at Unknown time  . omeprazole (PRILOSEC) 20 MG capsule Take 20 mg by mouth 2 (two) times daily before a meal.   10/16/2016 at Unknown time  . oxyCODONE (OXY IR/ROXICODONE) 5 MG immediate release tablet Take 5  mg by mouth every 6 (six) hours as needed for severe pain.   prn at prn  . QUEtiapine (SEROQUEL) 100 MG tablet Take 100 mg by mouth at bedtime. Take 100mg  at bedtime for 14 days. Then take 50mg  at bedtime for 7 days. Then stop.   Past Week at Unknown time  . sertraline (ZOLOFT) 100 MG tablet Take 200 mg by mouth daily.    10/16/2016 at Unknown time   Scheduled: . aspirin  300 mg Rectal Daily   Or  . aspirin  325 mg Oral Daily  . atorvastatin  40 mg Oral Daily  . cholecalciferol  2,000 Units Oral Daily  . enoxaparin (LOVENOX) injection  40 mg Subcutaneous Q24H  . fludrocortisone  0.1 mg Oral Daily  . latanoprost  1 drop Both Eyes  QHS  . midodrine  10 mg Oral TID WC  . nystatin  10 mL Oral BID  . QUEtiapine  100 mg Oral QHS  . sertraline  200 mg Oral Daily    ROS: History obtained from the patient  General ROS: negative for - chills, fatigue, fever, night sweats, weight gain or weight loss Psychological ROS: negative for - behavioral disorder, hallucinations, memory difficulties, mood swings or suicidal ideation Ophthalmic ROS: negative for - blurry vision, double vision, eye pain or loss of vision ENT ROS: negative for - epistaxis, nasal discharge, oral lesions, sore throat, tinnitus or vertigo Allergy and Immunology ROS: negative for - hives or itchy/watery eyes Hematological and Lymphatic ROS: negative for - bleeding problems, bruising or swollen lymph nodes Endocrine ROS: negative for - galactorrhea, hair pattern changes, polydipsia/polyuria or temperature intolerance Respiratory ROS: negative for - cough, hemoptysis, shortness of breath or wheezing Cardiovascular ROS: negative for - chest pain, dyspnea on exertion, edema or irregular heartbeat Gastrointestinal ROS: negative for - abdominal pain, diarrhea, hematemesis, nausea/vomiting or stool incontinence Genito-Urinary ROS: negative for - dysuria, hematuria, incontinence or urinary frequency/urgency Musculoskeletal ROS: negative for - joint swelling or muscular weakness Neurological ROS: as noted in HPI Dermatological ROS: negative for rash and skin lesion changes  Physical Examination: Blood pressure 136/84, pulse 69, temperature 97.5 F (36.4 C), temperature source Oral, resp. rate 18, height 6' (1.829 m), weight 82.6 kg (182 lb), SpO2 99 %.  HEENT-  Normocephalic, no lesions, without obvious abnormality.  Normal external eye and conjunctiva.  Normal TM's bilaterally.  Normal auditory canals and external ears. Normal external nose, mucus membranes and septum.  Normal pharynx. Cardiovascular- S1, S2 normal, pulses palpable throughout   Lungs- chest clear,  no wheezing, rales, normal symmetric air entry Abdomen- soft, non-tender; bowel sounds normal; no masses,  no organomegaly Extremities- no edema Lymph-no adenopathy palpable Musculoskeletal-no joint tenderness, deformity or swelling Skin-warm and dry, no hyperpigmentation, vitiligo, or suspicious lesions  Neurological Examination   Mental Status: Alert, oriented, thought content appropriate.  Speech fluent without evidence of aphasia.  Able to follow 3 step commands without difficulty. Cranial Nerves: II: Discs flat bilaterally; Visual fields grossly normal, pupils equal, round, reactive to light and accommodation III,IV, VI: left ptosis, extra-ocular motions intact bilaterally V,VII: right facial droop, facial light touch sensation normal bilaterally VIII: hearing normal bilaterally IX,X: gag reflex present XI: bilateral shoulder shrug XII: midline tongue extension Motor: Right : Upper extremity   5/5    Left:     Upper extremity   5/5  Lower extremity   5/5     Lower extremity   5/5 proximally with left foot drop Tone and bulk:normal tone  throughout; no atrophy noted Sensory: Pinprick and light touch intact throughout, bilaterally Deep Tendon Reflexes: 2+ and symmetric throughout Plantars: Right: mute   Left: mute Cerebellar: Normal finger-to-nose and normal heel-to-shin testing bilaterally Gait: not tested due to safety concerns    Laboratory Studies:   Basic Metabolic Panel:  Recent Labs Lab 10/16/16 2206  NA 140  K 4.2  CL 106  CO2 30  GLUCOSE 95  BUN 26*  CREATININE 1.19  CALCIUM 8.7*    Liver Function Tests:  Recent Labs Lab 10/16/16 2206  AST 19  ALT 18  ALKPHOS 78  BILITOT 0.5  PROT 6.5  ALBUMIN 3.7   No results for input(s): LIPASE, AMYLASE in the last 168 hours. No results for input(s): AMMONIA in the last 168 hours.  CBC:  Recent Labs Lab 10/16/16 2206  WBC 3.7*  NEUTROABS 2.5  HGB 12.4*  HCT 36.7*  MCV 91.4  PLT 92*    Cardiac  Enzymes:  Recent Labs Lab 10/16/16 2206  TROPONINI <0.03    BNP: Invalid input(s): POCBNP  CBG:  Recent Labs Lab 10/17/16 0726  GLUCAP 89    Microbiology: No results found for this or any previous visit.  Coagulation Studies:  Recent Labs  10/16/16 2206  LABPROT 14.0  INR 1.08    Urinalysis:  Recent Labs Lab 10/16/16 2304  COLORURINE STRAW*  LABSPEC 1.004*  PHURINE 6.0  GLUCOSEU NEGATIVE  HGBUR NEGATIVE  BILIRUBINUR NEGATIVE  KETONESUR NEGATIVE  PROTEINUR NEGATIVE  NITRITE NEGATIVE  LEUKOCYTESUR NEGATIVE    Lipid Panel:     Component Value Date/Time   CHOL 92 10/17/2016 0506   TRIG 87 10/17/2016 0506   HDL 39 (L) 10/17/2016 0506   CHOLHDL 2.4 10/17/2016 0506   VLDL 17 10/17/2016 0506   LDLCALC 36 10/17/2016 0506    HgbA1C: No results found for: HGBA1C  Urine Drug Screen:     Component Value Date/Time   LABOPIA NONE DETECTED 10/16/2016 2304   COCAINSCRNUR NONE DETECTED 10/16/2016 2304   LABBENZ NONE DETECTED 10/16/2016 2304   AMPHETMU NONE DETECTED 10/16/2016 2304   THCU NONE DETECTED 10/16/2016 2304   LABBARB NONE DETECTED 10/16/2016 2304    Alcohol Level:  Recent Labs Lab 10/16/16 2206  Hull <5    Other results: EKG: sinus rhythm at 81 bpm.  Imaging: Mr Angiogram Neck W Or Wo Contrast  Result Date: 10/17/2016 CLINICAL DATA:  Initial evaluation for slurred speech with myoclonic jerks. EXAM: MRA NECK WITHOUT AND WITH CONTRAST TECHNIQUE: Multiplanar and multiecho pulse sequences of the neck were obtained without and with intravenous contrast. Angiographic images of the neck were obtained using MRA technique without and with intravenous contrast. CONTRAST:  74mL MULTIHANCE GADOBENATE DIMEGLUMINE 529 MG/ML IV SOLN COMPARISON:  None available. FINDINGS: Study is moderately degraded by motion artifact. Partially visualized aortic arch of normal caliber with normal 3 vessel morphology. No high-grade stenosis seen about the origin of the great  vessels. Visualized subclavian arteries are widely patent. Right common carotid artery patent from its origin to the bifurcation. No significant atheromatous stenosis about the right bifurcation/proximal right ICA. Right internal carotid artery patent distally to the circle of Willis. No flow-limiting stenosis, dissection, or vascular occlusion within the right carotid artery system. Left common carotid artery patent from its origin to the bifurcation. No significant atheromatous narrowing about the left bifurcation. Left ICA patent distally to the circle of Willis. No stenosis, dissection, or vascular occlusion within the left carotid artery system. Both vertebral arteries arise  from the subclavian arteries. Vertebral arteries widely patent within the neck without flow-limiting stenosis, dissection, or occlusion. IMPRESSION: 1. Mildly limited study due to motion artifact. 2. Normal MRA of the neck. No high-grade or flow-limiting stenosis identified. Electronically Signed   By: Jeannine Boga M.D.   On: 10/17/2016 02:31   Mr Brain Wo Contrast  Result Date: 10/17/2016 CLINICAL DATA:  Initial evaluation for slurred speech, myoclonic jerks. EXAM: MRI HEAD WITHOUT CONTRAST TECHNIQUE: Multiplanar, multiecho pulse sequences of the brain and surrounding structures were obtained without intravenous contrast. COMPARISON:  Prior CT from 10/16/2016. FINDINGS: Brain: Study moderately degraded by motion artifact. Generalized age-related cerebral atrophy. Minimal T2/FLAIR hyperintensity within the periventricular white matter and pons, likely related to chronic small vessel ischemic changes, felt to be within normal limits for age. No abnormal foci of restricted diffusion to suggest acute or subacute ischemia. Gray-white matter differentiation maintained. No areas of chronic infarction identified. No acute or chronic intracranial hemorrhage. No mass lesion, midline shift or mass effect. No hydrocephalus. No extra-axial  fluid collection. Major dural sinuses are grossly patent. Pituitary gland suprasellar region within normal limits. Vascular: Major intracranial vascular flow voids are maintained. Skull and upper cervical spine: Craniocervical junction within normal limits. Visualized upper cervical spine unremarkable. Bone marrow signal intensity within normal limits. No scalp soft tissue abnormality. Sinuses/Orbits: Globes and orbital soft tissues within normal limits. Mild scattered mucosal thickening within the ethmoidal air cells and maxillary sinuses. Paranasal sinuses are otherwise clear. Small right mastoid effusion noted. Inner ear structures normal. Other: None. IMPRESSION: Normal brain MRI for age. No acute intracranial infarct or other process identified. Electronically Signed   By: Jeannine Boga M.D.   On: 10/17/2016 02:13   US Carotid Bilateral (at [facility] And [facility] Only)  Result Date: 10/17/2016 CLINICAL DATA:  Stroke. EXAM: BILATERAL CAROTID DUPLEX ULTRASOUND TECHNIQUE: Pearline Cables scale imaging, color Doppler and duplex ultrasound were performed of bilateral carotid and vertebral arteries in the neck. COMPARISON:  10/16/2016 . FINDINGS: Criteria: Quantification of carotid stenosis is based on velocity parameters that correlate the residual internal carotid diameter with NASCET-based stenosis levels, using the diameter of the distal internal carotid lumen as the denominator for stenosis measurement. The following velocity measurements were obtained: RIGHT ICA:  71/31 cm/sec CCA:  09/98 cm/sec SYSTOLIC ICA/CCA RATIO:  0.9 DIASTOLIC ICA/CCA RATIO:  1.5 ECA:  63 cm/sec LEFT ICA:  59/28 cm/sec CCA:  33/82 cm/sec SYSTOLIC ICA/CCA RATIO:  1.0 DIASTOLIC ICA/CCA RATIO:  1.6 ECA:  53 cm/sec RIGHT CAROTID ARTERY: No significant carotid atherosclerotic vascular disease. No flow limiting stenosis. RIGHT VERTEBRAL ARTERY:  Patent with antegrade flow. LEFT CAROTID ARTERY: No significant carotid atherosclerotic vascular disease. No  flow limiting stenosis. LEFT VERTEBRAL ARTERY:  Patent with antegrade flow. IMPRESSION: 1. No significant carotid atherosclerotic vascular disease. No flow limiting stenosis. 2. Vertebrals are patent with antegrade flow. Electronically Signed   By: Marcello Moores  Register   On: 10/17/2016 10:46   Ct Head Code Stroke W/o Cm  Result Date: 10/16/2016 CLINICAL DATA:  Code stroke. Initial evaluation for acute slurred speech. EXAM: CT HEAD WITHOUT CONTRAST TECHNIQUE: Contiguous axial images were obtained from the base of the skull through the vertex without intravenous contrast. COMPARISON:  None available. FINDINGS: Brain: Generalized age-related cerebral atrophy. No acute intracranial hemorrhage. No evidence for acute large vessel territory infarct. No mass lesion, midline shift or mass effect. No hydrocephalus. No extra-axial fluid collection. Subcentimeter calcification noted at the inferior aspect of the fourth ventricle. Vascular: No hyperdense  vessel. Skull: Scalp soft tissues within normal limits.  Calvarium intact. Sinuses/Orbits: Globes and oval soft tissues within normal limits. Paranasal sinuses are clear. No mastoid effusion. Other: None. ASPECTS [facility] Stroke Program Early CT Score) - Ganglionic level infarction (caudate, lentiform nuclei, internal capsule, insula, M1-M3 cortex): 7 - Supraganglionic infarction (M4-M6 cortex): 3 Total score (0-10 with 10 being normal): 10 IMPRESSION: 1. No acute intracranial infarct or other process identified. 2. ASPECTS is 10 Critical Value/emergent results were called by telephone at the time of interpretation on 10/16/2016 at 10:35 pm to Dr. Larae Grooms , who verbally acknowledged these results. Electronically Signed   By: Jeannine Boga M.D.   On: 10/16/2016 22:38     Assessment/Plan: 66 year old male presenting with slurred speech and bilateral numbness and tremors.  Description not consistent with seizure or stroke.  MRI of the brain reviewed and shows no  acute changes.  MRA of the neck unremarkable as well.  Possibly related to orthostasis.    Recommendations: 1. Orthostatic vitals 2. Patient to continue follow up on an outpatient basis.   Case discussed with Dr. Fredirick Maudlin, MD Neurology [phone] 10/17/2016, 2:07 PM

## 2016-10-17 NOTE — ED Notes (Signed)
Pt & family voice understanding of NPO status until eval by ST; given glycerin swabs and lip moisturizer for comfort

## 2016-10-17 NOTE — Progress Notes (Signed)
Patient given discharge teaching and paperwork regarding medications, diet, follow-up appointments and activity. Patient understanding verbalized. No complaints at this time. . IV and telemetry discontinued prior to leaving. Skin assessment as previously charted and vitals are stable; on room air. Patient being discharged to home. Caregiver/family present during discharge teaching.

## 2016-10-17 NOTE — Progress Notes (Signed)
Initial Nutrition Assessment  DOCUMENTATION CODES:   Non-severe (moderate) malnutrition in context of chronic illness  INTERVENTION:  Recommend Ensure Enlive po TID, each supplement provides 350 kcal and 20 grams of protein. Patient prefers chocolate.  NUTRITION DIAGNOSIS:   Malnutrition (Moderate) related to chronic illness (oral cancer s/p surgery and XRT) as evidenced by 17.3 percent weight loss over 5 months, moderate depletions of muscle mass, moderate depletion of body fat.  GOAL:   Patient will meet greater than or equal to 90% of their needs  MONITOR:   PO intake, Supplement acceptance, Labs, Weight trends, I & O's  REASON FOR ASSESSMENT:   Malnutrition Screening Tool    ASSESSMENT:   66 year old male with PMHx of DM type 2, GERD, oral cancer s/p surgery and  XRT. Workup negative for CVA.   Spoke with patient and family members at bedside. Patient reports his appetite is poor and he has to force himself to eat. He reports difficulty chewing and swallowing due to pain after his surgery and XRT. He is [facility] by [facility] for oncology. Reports for lunch he may have a sandwich (ham and cheese, peanut butter and jelly). For dinner he has meat, potato, and dessert. He is unable to finish his meals about 50% of the time per patient. Reports drinking chocolate Boost High Protein 2-3 times per day. Patient reports he had been using Xylocaine mouthwash at home for pain with chewing/swallowing but he reports SLP told him it put him at increased risk of aspiration so he will stop using this.   UBW was 220 lbs. He began losing weight since 05/29/2016, which he reports is the day of his operation. He has lost 38 lbs (17.3% body weight) over 5 months, which is significant for time frame.  Medications reviewed and include: vitamin D 2000 units daily, NS @ 125 ml/hr, Xylocaine mouthwash TID PRN for mouth pain.   Labs reviewed: BUN 26.   Nutrition-Focused physical exam completed. Findings are  mild-moderate fat depletion, mild-moderate muscle depletion, and no edema.   Diet Order:  DIET DYS 3 Room service appropriate? Yes with Assist; Fluid consistency: Thin  Skin:  Reviewed, no issues  Last BM:  Unknown  Height:   Ht Readings from Last 1 Encounters:  10/16/16 6' (1.829 m)    Weight:   Wt Readings from Last 1 Encounters:  10/16/16 182 lb (82.6 kg)    Ideal Body Weight:  80.9 kg  BMI:  Body mass index is 24.68 kg/m.  Estimated Nutritional Needs:   Kcal:  5631-4970 (MSJ x 1.3-1.5)  Protein:  100-125 grams (1.2-1.5 grams/kg)  Fluid:  2.4 L/day (25 ml/kg)  EDUCATION NEEDS:   Education needs no appropriate at this time (Pt reports he does not need education)  Willey Blade, MS, RD, LDN Pager: [phone] After Hours Pager: [phone]

## 2016-10-17 NOTE — Care Management (Signed)
Placed in observation for dysarthria.  neuro work up in progress. Independent in all adls, denies issues accessing medical care, obtaining medications or with transportation.

## 2016-10-17 NOTE — Progress Notes (Signed)
Rounding with Dr. Benjie Karvonen - per MD can d/c orders for q2h neuro checks and vitals.

## 2016-10-18 LAB — ECHOCARDIOGRAM COMPLETE
Height: 72 in
Weight: 2912 oz

## 2016-10-18 LAB — HEMOGLOBIN A1C
Hgb A1c MFr Bld: 5.2 % (ref 4.8–5.6)
Mean Plasma Glucose: 103 mg/dL

## 2017-05-25 DIAGNOSIS — H532 Diplopia: Secondary | ICD-10-CM | POA: Diagnosis not present

## 2017-05-25 DIAGNOSIS — H538 Other visual disturbances: Secondary | ICD-10-CM | POA: Diagnosis not present

## 2017-05-25 DIAGNOSIS — M6281 Muscle weakness (generalized): Secondary | ICD-10-CM | POA: Diagnosis not present

## 2017-05-25 DIAGNOSIS — R51 Headache: Secondary | ICD-10-CM | POA: Diagnosis not present

## 2017-05-25 DIAGNOSIS — S0502XA Injury of conjunctiva and corneal abrasion without foreign body, left eye, initial encounter: Secondary | ICD-10-CM | POA: Diagnosis not present

## 2017-05-25 DIAGNOSIS — H01006 Unspecified blepharitis left eye, unspecified eyelid: Secondary | ICD-10-CM | POA: Diagnosis not present

## 2017-05-25 DIAGNOSIS — H409 Unspecified glaucoma: Secondary | ICD-10-CM | POA: Diagnosis not present

## 2017-05-25 DIAGNOSIS — H02402 Unspecified ptosis of left eyelid: Secondary | ICD-10-CM | POA: Diagnosis not present

## 2017-05-25 DIAGNOSIS — B0230 Zoster ocular disease, unspecified: Secondary | ICD-10-CM | POA: Diagnosis not present

## 2017-05-25 DIAGNOSIS — B0239 Other herpes zoster eye disease: Secondary | ICD-10-CM | POA: Diagnosis not present

## 2017-05-25 DIAGNOSIS — E119 Type 2 diabetes mellitus without complications: Secondary | ICD-10-CM | POA: Diagnosis not present

## 2017-05-25 DIAGNOSIS — H01003 Unspecified blepharitis right eye, unspecified eyelid: Secondary | ICD-10-CM | POA: Diagnosis not present

## 2017-05-25 DIAGNOSIS — H04123 Dry eye syndrome of bilateral lacrimal glands: Secondary | ICD-10-CM | POA: Diagnosis not present

## 2017-06-24 DIAGNOSIS — H04123 Dry eye syndrome of bilateral lacrimal glands: Secondary | ICD-10-CM | POA: Diagnosis not present

## 2018-10-26 ENCOUNTER — Other Ambulatory Visit: Payer: Self-pay | Admitting: Neurology

## 2018-10-26 DIAGNOSIS — I639 Cerebral infarction, unspecified: Secondary | ICD-10-CM

## 2018-11-03 DIAGNOSIS — Z85818 Personal history of malignant neoplasm of other sites of lip, oral cavity, and pharynx: Secondary | ICD-10-CM | POA: Diagnosis present

## 2018-11-03 DIAGNOSIS — M109 Gout, unspecified: Secondary | ICD-10-CM | POA: Insufficient documentation

## 2018-11-03 DIAGNOSIS — G4733 Obstructive sleep apnea (adult) (pediatric): Secondary | ICD-10-CM | POA: Insufficient documentation

## 2018-11-04 ENCOUNTER — Other Ambulatory Visit: Payer: Self-pay

## 2018-11-04 ENCOUNTER — Ambulatory Visit
Admission: RE | Admit: 2018-11-04 | Discharge: 2018-11-04 | Disposition: A | Discharge status: Home / Self Care | Payer: No Typology Code available for payment source | Source: Ambulatory Visit | Attending: Neurology | Admitting: Neurology

## 2018-11-04 DIAGNOSIS — I639 Cerebral infarction, unspecified: Secondary | ICD-10-CM | POA: Diagnosis present

## 2018-11-04 LAB — POCT I-STAT CREATININE: Creatinine, Ser: 1.7 mg/dL — ABNORMAL HIGH (ref 0.61–1.24)

## 2018-11-04 MED ORDER — GADOBUTROL 1 MMOL/ML IV SOLN
7.5000 mL | Freq: Once | INTRAVENOUS | Status: AC | PRN
  Administered 2018-11-04: 7.5 mL via INTRAVENOUS

## 2018-11-04 MED ORDER — GADOBUTROL 1 MMOL/ML IV SOLN: 7.5000 mL | Freq: Once | INTRAVENOUS | Status: DC | PRN

## 2018-12-02 NOTE — Progress Notes (Signed)
 Today the history is gathered from: 10% - patient  90% - patient's wife  RECORDS SUMMARY: Self-referred patient complaining of right sided facial numbness and tinnitus. Personal history of cancer.   REFERRING PHYSICIAN: Self PRIMARY CARE PHYSICIAN:  Merrianne Newness, Mitzie, NP (Inactive)  IMPRESSION/PLAN  Ronnie Strickland is a 68 y.o. male presenting for evaluation of  FACIAL NUMBNESS/ TINNITUS - Improving.  - Patient with ongoing right sided facial numbness and resolved tinnitus. Patient was seen by ENT at [facility] and a cyst was found in his ear. Patient is on antibiotics for the cyst and tinnitus has since resolved.  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  - Continue aspirin  81 mg daily.  - Discontinue clonazepam 0.25 mg twice daily.   Follow up with Dr. Lane as needed.   p=4   CHIEF COMPLAINT & HPI  Ronnie Strickland is a 68 y.o. male presenting for evaluation of: Chief Complaint  Patient presents with  . FACIAL NUMBNESS/ TINNITUS    FACIAL NUMBNESS/ TINNITUS Patient's wife states the patient's facial numbness is not as bad  as it was before. He has some feeling in his face. The  patient was seen at [facility], through the [facility] for the tinnitus. They found a cyst in his ear. He was placed on antibiotic. Since being on antibiotic tinnitus has completely gone away. He no longer is having buzzing sound. Occasionally has a little dizziness. They are going to follow up with ENT for cyst next week.   DATA SUMMARY: 11/04/2018 MRI BRAIN W WO CONTRAST  IMPRESSION: 1. No acute intracranial process or abnormal enhancement of the brain. 2. Mild chronic microvascular ischemic changes and volume loss of the brain. 3. 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 Direct visualization and temporal bone CT is recommended to further characterize.    VISIT SUMMARIES: 12/02/18: Patient with right sided facial numbness,  improving, and tinnitus, resolved. Continue aspirin  81 mg daily. Discontinue clonazepam 0.25 mg (1/2 tab) twice daily for tinnitus.   10/25/18: Patient with right sided facial numbness and tinnitus new to me. Order brain MRI with and without contrast to evaluate for stroke. Start aspirin  81 mg daily for blood thinning. Start clonazepam 0.25 mg (1/2 tab) twice daily for tinnitus.    MEDICATIONS Outpatient Medications Marked as Taking for the 12/02/18 encounter (Telemedicine) with Lane Arthea Locus, MD  Medication Sig  . atorvastatin  (LIPITOR) 80 MG tablet Take 80 mg by mouth once daily  . carbamide peroxide (DEBROX) 6.5 % otic solution 5 drops 2 (two) times daily  . cholecalciferol (VITAMIN D3) 1,000 unit capsule Take 1,000 Units by mouth once daily  . clonazePAM (KLONOPIN) 0.5 MG tablet Take 1/2 tab twice a day  . cyanocobalamin (VITAMIN B12) 1000 MCG tablet Take 1,000 mcg by mouth once daily  . dorzolamide-timoloL (COSOPT) 22.3-6.8 mg/mL ophthalmic solution 1 drop 2 (two) times daily  . gabapentin (NEURONTIN) 300 MG capsule Take 300 mg by mouth 3 (three) times daily 1-2 at night  . lidocaine  HCL (XYLOCAINE ) 4 % topical solution Apply 1 mL topically as needed  . memantine (NAMENDA) 10 MG tablet Take 10 mg by mouth 2 (two) times daily  . sertraline  (ZOLOFT ) 100 MG tablet Take 100 mg by mouth once daily  . traZODone (DESYREL) 50 MG tablet Take 50 mg by mouth nightly    ALLERGIES No Known Allergies   EXAM  There were no vitals filed for this visit.  There is no height or weight on file to calculate BMI.  We were not able to do thorough physical exam during this televisit. Neurological exam is a crucial part of patient evaluation and lack of it can lead to misdiagnosis or missed diagnosis. If patient has concerns, they should consider making in person appointment with the provider. Provider should not be liable for consequences of lack of in person exam.  The exam below was completed at  the last in-office visit or is the default response. Unable to complete an exam today due to COVID-19 precautions.    GENERAL: Pleasant male, in no apparent distress Normocephalic and atraumatic.  EYES: Funduscopic exam shows normal disc size, appearance and C/D ratio without clear evidence of papilledema.  deferred  CARDIOVASCULAR: S1 and S2 sounds are within normal limits, without murmurs, gallops, or rubs.   deferred  MUSCULOSKELETAL: Bulk - Normal Tone - Normal Pronator Drift - Absent bilaterally. Ambulation - Gait and station is deferred Romberg - deferred  R/L 5/5    Shoulder abduction (deltoid/supraspinatus, axillary/suprascapular n, C5) 5/5    Elbow flexion (biceps brachii, musculoskeletal n, C5-6) 5/5    Elbow extension (triceps, radial n, C7) 5/5    Finger adduction (interossei, ulnar n, T1)  5/5    Hip flexion (iliopsoas, L1/L2) 5/5    Knee flexion (hamstrings, sciatic n, L5/S1)  5/5    Knee extension (quadriceps, femoral n, L3/4) 5/5    Ankle dorsiflexion (tibialis anterior, deep fibular n, L4/5) 5/5    Ankle plantarflexion (gastroc, tibial n, S1)   NEUROLOGICAL: MENTAL STATUS: Patient is oriented to person, place and time.   Short-term memory is deferred Long-term memory is intact.   Attention span and concentration are intact.   Naming and repetition are intact. Comprehension is intact.   Expressive speech is intact.   Patient's fund of knowledge is within normal limits for educational level.  CRANIAL NERVES: Visual acuity and visual fields are intact         Extraocular muscles are intact                        Facial sensation is intact bilaterally                Facial strength is intact bilaterally                   Hearing is intact bilaterally                              Palate elevates midline, normal phonation     Shoulder shrug strength is intact                    Tongue protrudes midline                       SENSATION: Pain and  temperature (spinothalamic tracts) is normal. Position and vibration (dorsal columns) is normal.  REFLEXES: R/L 2+/2+    Biceps 2+/2+    Brachioradialis  2+/2+    Patellar 2+/2+    Achilles  COORDINATION/CEREBELLAR: Finger to nose testing is deferred      PAST MEDICAL HISTORY Past Medical History:  Diagnosis Date  . 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 Benign neoplasm of choroid 11/03/2018  . Benign neoplasm of transverse colon 11/03/2018  . Borderline glaucoma 11/03/2018  . Chest pain 11/03/2018  . Chronic  GERD 11/03/2018  . Closed fracture of fifth metatarsal bone 11/03/2018  . Dementia (CMS-HCC) 11/03/2018  . Dermatochalasis of both upper eyelids 08/05/2017  . Diabetes mellitus type 2, uncomplicated (CMS-HCC)   . Diplopia   . Dysarthria 10/17/2016  . Dysphagia 11/03/2018  . Essential hypertension 11/03/2018  . Gastritis   . Gastroesophageal reflux disease 11/03/2018   Mar 24, 2018 Entered By: TERE MITZIE GAILS Comment: colonoscopy 2019 wnl; 10 yr FU if any  Dec 07, 2015 Entered By: TERE MITZIE GAILS Comment: Barium Swallow test June 2017  . Glaucoma   . Gout   . Gout, joint   . Headache 06/02/2017  . History of cancer   . 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 Hypotension   . Hypotension 11/03/2018  . Mild cognitive impairment 11/03/2018  . Obstructive sleep apnea of adult 11/03/2018   Jul 30, 2018 Entered By: NANCI POLLEN Comment: AHI: 16; cpap 6-12; swift fx  . Osteoarthritis   . Osteoarthritis of left acromioclavicular joint 11/03/2018  . Post traumatic stress disorder (PTSD), unspecified   . 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 Psychosexual dysfunction with inhibited sexual excitement 11/03/2018  . Ptosis   . Ptosis of eyelid 06/02/2017  . PTSD (post-traumatic stress disorder) 11/03/2018  . 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 Right ankle  sprain 04/21/2013  . Right facial numbness 10/27/2018  . Rotator cuff syndrome 11/03/2018  . Sleep apnea   . Sleep behavior disorder, REM   . Special screening for malignant neoplasms, colon 11/03/2018  . Splenomegaly   . 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 Tinnitus of right ear 10/27/2018  . Type 2 diabetes mellitus with diabetic neuropathy (CMS-HCC) 11/03/2018    PAST SURGICAL HISTORY Past Surgical History:  Procedure Laterality Date  . Carotid gland cancer surgery      FAMILY HISTORY Family History  Problem Relation Age of Onset  . No Known Problems Mother   . No Known Problems Father     SOCIAL HISTORY  Social History   Tobacco Use  . Smoking status: Unknown If Ever Smoked  . Smokeless tobacco: Never Used  Substance Use Topics  . Alcohol use: Defer  . Drug use: Not Currently    REVIEW OF SYSTEMS:  13 system ROS form was given to the patient to complete and I have reviewed it.  The form was sent for scan to the patient's EHR.  Pertinent positives and negatives are mentioned above in the HPI and all other systems are negative.   DATA  I have personally reviewed all of the data outlined below both prior to the appointment and during the appointment with the patient as appropriate.  No visits with results within 6 Month(s) from this visit.  Latest known visit with results is:  No results found for any previous visit.   CLINICAL DATA: 68 y/o M; facial numbness and buzzing in the right ear ongoing for 2 and half months. 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  EXAM: MRI HEAD WITHOUT AND WITH CONTRAST  TECHNIQUE: Multiplanar, multiecho pulse sequences of the brain and surrounding structures were obtained without and with intravenous contrast.  CONTRAST: 7.5 cc Gadavist   COMPARISON: 10/16/2016 MRI head and CT head.  FINDINGS: Brain: No acute infarction, hemorrhage, hydrocephalus, extra-axial collection or mass lesion. No  abnormal enhancement of brain parenchyma. Scattered punctate nonspecific  T2 FLAIR hyperintensities in subcortical and periventricular white matter are compatible with mild chronic microvascular ischemic changes.  Vascular: Normal flow voids.  Skull and upper cervical spine: Normal marrow signal.  Sinuses/Orbits: The right external auditory canal is opacified with rim enhanced, T2 hyperintense, T1 hypointense, and reduced diffusion structure. There is opacification of the right mastoid tip without enhancement and with heterogeneous T1 signal, likely chronic effusion and some granulation tissue. Tiny bilateral maxillary sinus mucous retention cyst. Otherwise no significant abnormal signal of the paranasal sinuses or the left mastoid air cells. Orbits are unremarkable.  Other: None.  IMPRESSION: 1. No acute intracranial process or abnormal enhancement of the brain. 2. Mild chronic microvascular ischemic changes and volume loss of the brain. 3. 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 Direct visualization and temporal bone CT is recommended to further characterize.   No follow-ups on file.  Payor: [employer] / Plan: [employer] / Product Type: Indemnity /   This video encounter was conducted with the patient's (or proxy's) verbal consent via secure, interactive audio and video telecommunications.    Patient was instructed to have this encounter in a suitably private space; and to only have persons present to whom they give permission to participate.  In addition, patient identity was confirmed by use of two identifiers (name and date of birth).   This note is partially prepared by Arleta Gore, Scribe, in the presence of and acting as the scribe of Dr. Arthea Farrow, MD.  I have reviewed, edited and added to the note as needed to reflect my best personal medical judgment.    Dr. Arthea Farrow,  MD [facility] [place], [place] Ph:  [phone] Fax:  [phone]  During this declared National Disaster and Covid-19 pandemic, the patient has requested to obtain treatment via telehealth. Both patient and care provider will, using this treatment method, endeavor to provide care and treatment that cannot be done in office.  Under these emergent conditions it is the best (or only) means of connecting. The patient has been advised of the potential risks and limitations of this mode of treatment (including, but not limited to, the absence of in-person examination) and has agreed to be treated in a remote fashion in spite of them.  Any and all of the patient's/patient's family's questions on this issue have been answered, and I have made no promises or guarantees to the patient.  The patient has also been advised to contact this office for worsening conditions or problems, and seek medical treatment and/or call 911 if the patient deems either necessary.

## 2018-12-03 ENCOUNTER — Ambulatory Visit: Payer: PPO

## 2020-01-30 IMAGING — MR MRI HEAD WITHOUT AND WITH CONTRAST
14 series · 45 of 48 positions shown · IV contrast (gadavist)
Comparison: 10/16/2016 MRI head and CT head.

CLINICAL DATA: 67 y/o M; facial numbness and buzzing in the right
ear ongoing for 2 and half months. History of oral cancer status
post surgery and radiation.

EXAM:
MRI HEAD WITHOUT AND WITH CONTRAST
TECHNIQUE: Multiplanar, multiecho pulse sequences of the brain and surrounding
structures were obtained without and with intravenous contrast.
CONTRAST:  7.5 cc Gadavist

[Series 5: ax dwi_tracew · axial · 3.0mm · 0.60mm/px · z∈[-35,+127]mm · 4 of 55 slices shown]
[im 1/55]
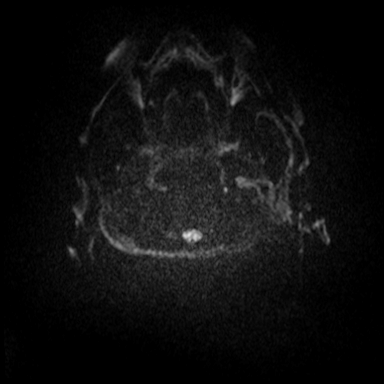
[im 19/55]
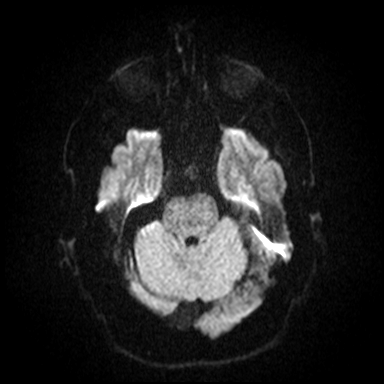
[im 37/55]
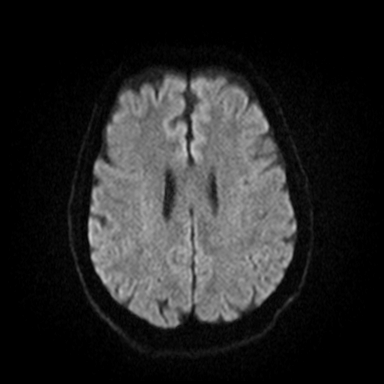
[im 55/55]
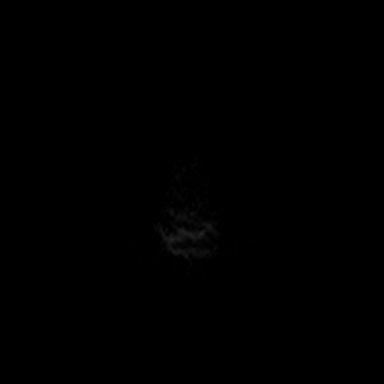

[Series 6: ax dwi_adc · axial · 3.0mm · 0.60mm/px · z∈[-35,+127]mm · 3 of 55 slices shown]
[im 1/55]
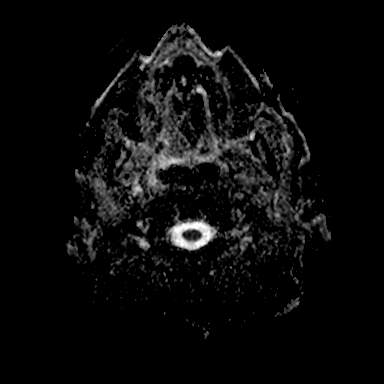
[im 28/55]
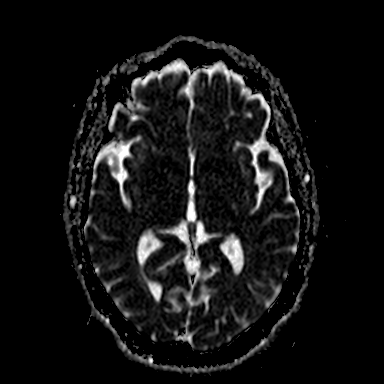
[im 55/55]
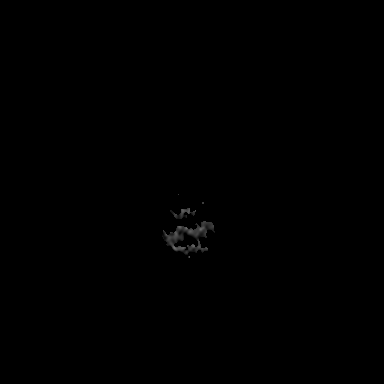

[Series 7: cor dwi_tracew · coronal · 5.0mm · 0.60mm/px · 2 of 39 slices shown]
[im 1/39]
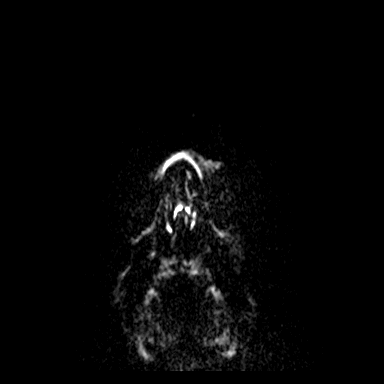
[im 39/39]
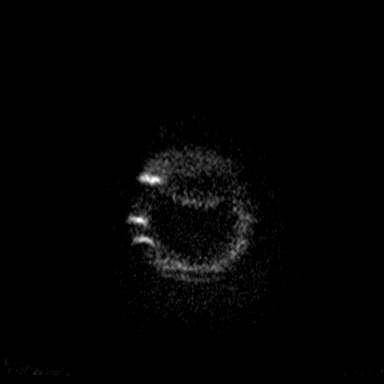

[Series 8: cor dwi_adc · coronal · 5.0mm · 0.60mm/px · 2 of 39 slices shown]
[im 1/39]
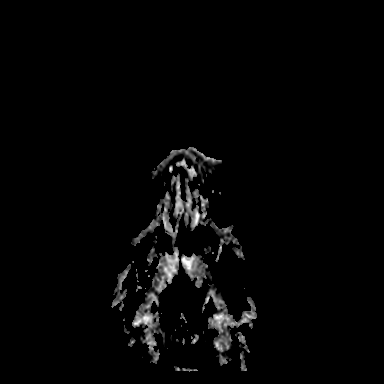
[im 39/39]
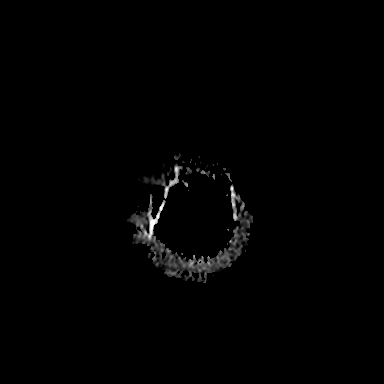

[Series 9: T1 · sagittal · 5.0mm · 0.62mm/px · 1 of 21 slices shown (1 of 2)]
[im 1/21]
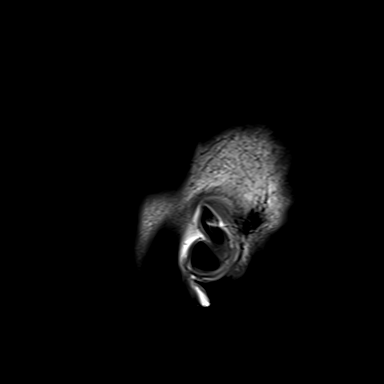

[Series 10: T2 · axial · 5.0mm · 0.53mm/px · z∈[-33,+123]mm · 2 of 27 slices shown]
[im 1/27]
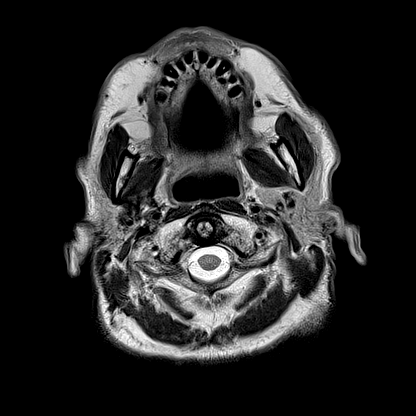
[im 27/27]
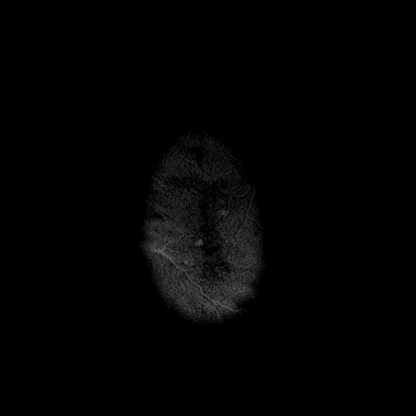

[Series 12: pha_images · axial · 3.0mm · 0.90mm/px · z∈[-43,+130]mm · 3 of 59 slices shown]
[im 1/59]
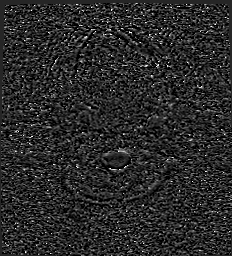
[im 30/59]
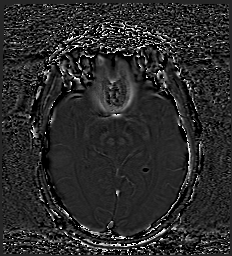
[im 59/59]
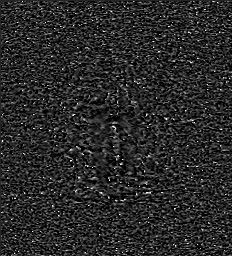

[Series 13: swi_images · axial · 3.0mm · 0.90mm/px · z∈[-43,+133]mm · 3 of 60 slices shown]
[im 1/60]
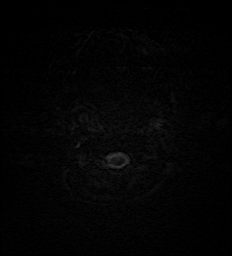
[im 30/60]
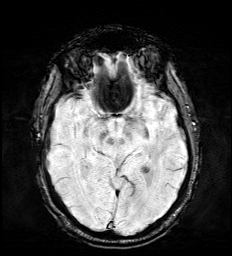
[im 60/60]
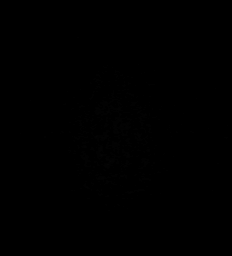

[Series 15: FLAIR · axial · 3.0mm · 0.53mm/px · z∈[-36,+126]mm · 3 of 55 slices shown]
[im 1/55]
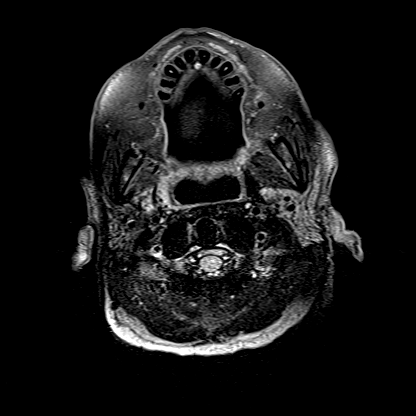
[im 28/55]
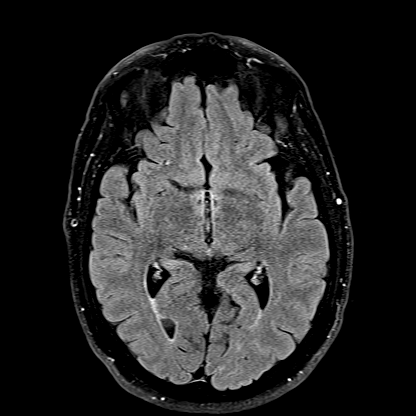
[im 55/55]
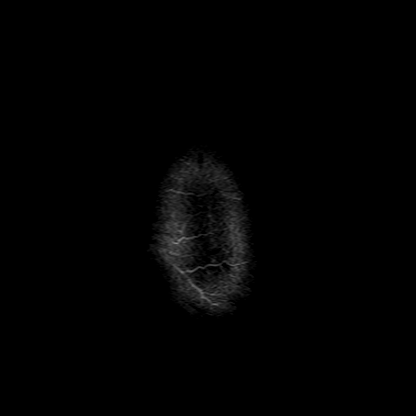

[Series 16: T1 · axial · 1.0mm · 0.98mm/px · z∈[-41,+133]mm · 8 of 176 slices shown (2 of 2)]
[im 1/176]
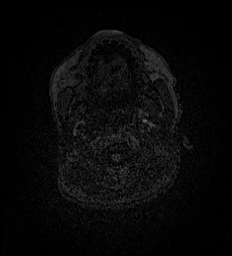
[im 20/176]
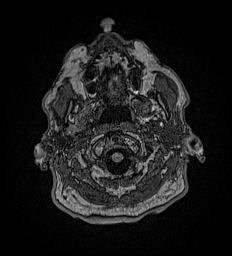
[im 59/176]
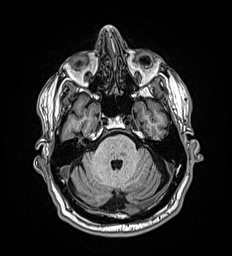
[im 78/176]
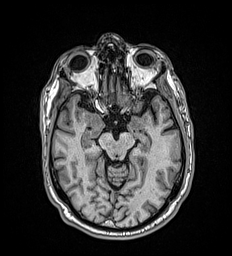
[im 98/176]
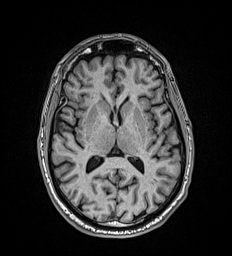
[im 117/176]
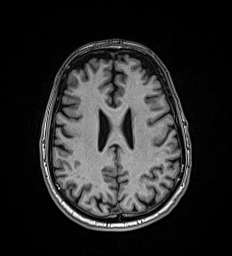
[im 156/176]
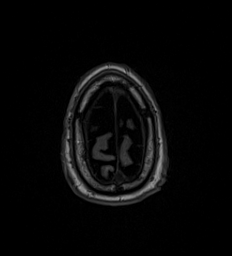
[im 176/176]
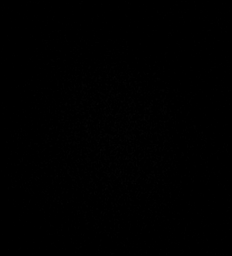

[Series 17: T2 post-contrast · coronal · 5.0mm · 0.57mm/px · 2 of 29 slices shown]
[im 1/29]
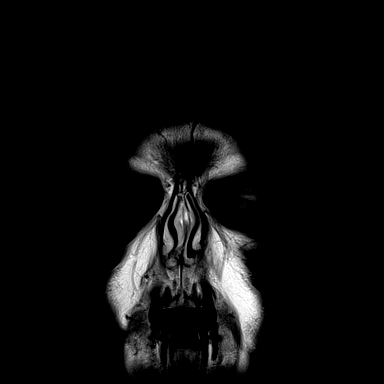
[im 29/29]
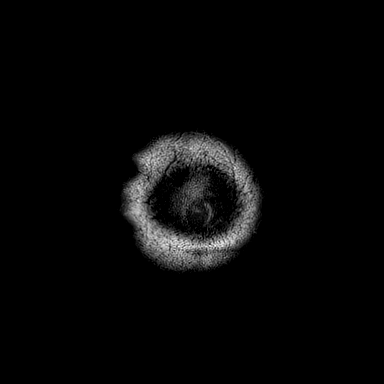

[Series 18: T1 post-contrast · axial · 1.0mm · 0.98mm/px · z∈[-41,+133]mm · 9 of 175 slices shown (1 of 3)]
[im 1/175]
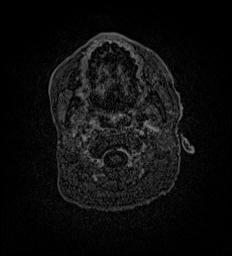
[im 20/175]
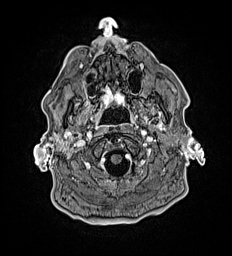
[im 39/175]
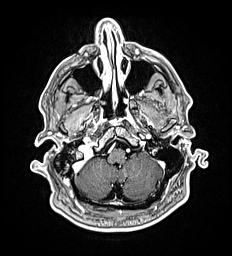
[im 59/175]
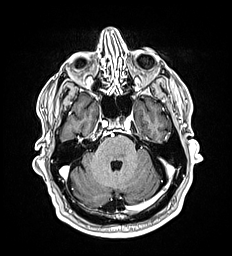
[im 78/175]
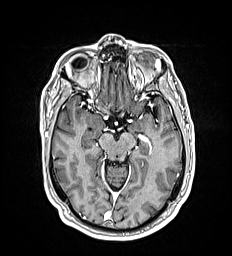
[im 97/175]
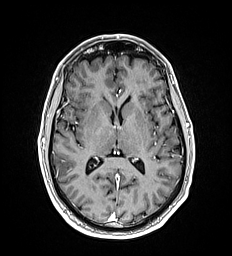
[im 117/175]
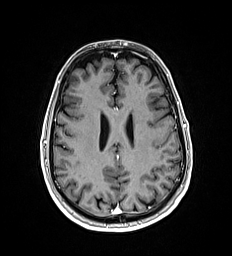
[im 155/175]
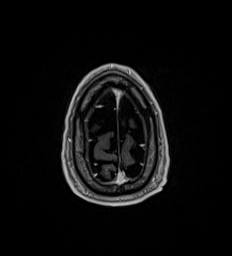
[im 175/175]
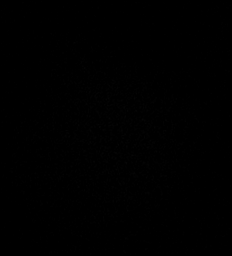

[Series 19: T1 post-contrast · coronal · 5.0mm · 0.57mm/px · 2 of 29 slices shown (2 of 3)]
[im 1/29]
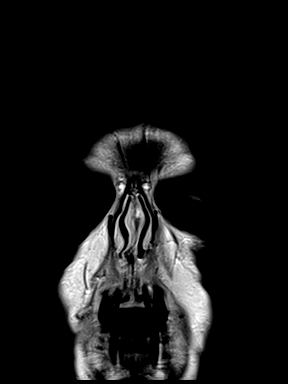
[im 29/29]
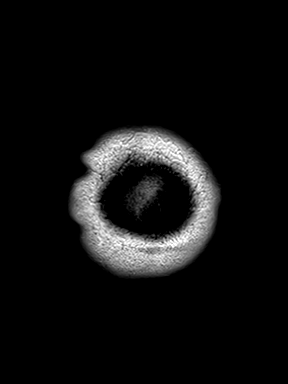

[Series 20: T1 post-contrast · sagittal · 5.0mm · 0.62mm/px · 1 of 21 slices shown (3 of 3)]
[im 1/21]
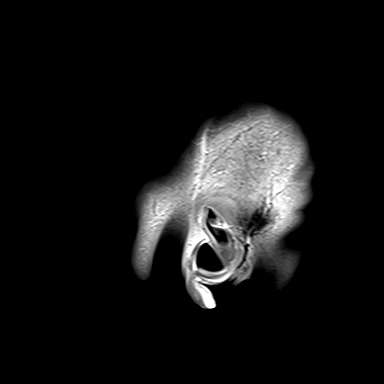

[45 of 48 positions shown; findings below may reference images not displayed]

FINDINGS: Brain: No acute infarction, hemorrhage, hydrocephalus, extra-axial
collection or mass lesion. No abnormal enhancement of brain
parenchyma. Scattered punctate nonspecific T2 FLAIR hyperintensities
in subcortical and periventricular white matter are compatible with
mild chronic microvascular ischemic changes.

Vascular: Normal flow voids.

Skull and upper cervical spine: Normal marrow signal.

Sinuses/Orbits: The right external auditory canal is opacified with
rim enhanced, T2 hyperintense, T1 hypointense, and reduced diffusion
structure. There is opacification of the right mastoid tip without
enhancement and with heterogeneous T1 signal, likely chronic
effusion and some granulation tissue. Tiny bilateral maxillary sinus
mucous retention cyst. Otherwise no significant abnormal signal of
the paranasal sinuses or the left mastoid air cells. Orbits are
unremarkable.

Other: None.
IMPRESSION: 1. No acute intracranial process or abnormal enhancement of the
brain.
2. Mild chronic microvascular ischemic changes and volume loss of
the brain.
3. Occlusion of the right external auditory canal, signal
characteristics suggest a EAC cholesteatoma. Direct visualization
and temporal bone CT is recommended to further characterize.

## 2022-01-01 ENCOUNTER — Emergency Department
Admission: EM | Admit: 2022-01-01 | Discharge: 2022-01-01 | Disposition: A | Discharge status: Home / Self Care | Payer: No Typology Code available for payment source | Attending: Student in an Organized Health Care Education/Training Program | Admitting: Student in an Organized Health Care Education/Training Program

## 2022-01-01 ENCOUNTER — Encounter: Payer: Self-pay | Admitting: Medical Oncology

## 2022-01-01 DIAGNOSIS — X58XXXA Exposure to other specified factors, initial encounter: Secondary | ICD-10-CM | POA: Diagnosis not present

## 2022-01-01 DIAGNOSIS — T50901A Poisoning by unspecified drugs, medicaments and biological substances, accidental (unintentional), initial encounter: Secondary | ICD-10-CM | POA: Diagnosis present

## 2022-01-01 DIAGNOSIS — T465X1A Poisoning by other antihypertensive drugs, accidental (unintentional), initial encounter: Secondary | ICD-10-CM | POA: Insufficient documentation

## 2022-01-01 LAB — CBC WITH DIFFERENTIAL/PLATELET
Abs Immature Granulocytes: 0.02 10*3/uL (ref 0.00–0.07)
Basophils Absolute: 0 10*3/uL (ref 0.0–0.1)
Basophils Relative: 0 %
Eosinophils Absolute: 0.1 10*3/uL (ref 0.0–0.5)
Eosinophils Relative: 3 %
HCT: 40.6 % (ref 39.0–52.0)
Hemoglobin: 13.5 g/dL (ref 13.0–17.0)
Immature Granulocytes: 0 %
Lymphocytes Relative: 16 %
Lymphs Abs: 0.8 10*3/uL (ref 0.7–4.0)
MCH: 31.8 pg (ref 26.0–34.0)
MCHC: 33.3 g/dL (ref 30.0–36.0)
MCV: 95.8 fL (ref 80.0–100.0)
Monocytes Absolute: 0.4 10*3/uL (ref 0.1–1.0)
Monocytes Relative: 7 %
Neutro Abs: 3.7 10*3/uL (ref 1.7–7.7)
Neutrophils Relative %: 74 %
Platelets: 106 10*3/uL — ABNORMAL LOW (ref 150–400)
RBC: 4.24 MIL/uL (ref 4.22–5.81)
RDW: 13 % (ref 11.5–15.5)
Smear Review: NORMAL
WBC: 5 10*3/uL (ref 4.0–10.5)
nRBC: 0 % (ref 0.0–0.2)

## 2022-01-01 LAB — BASIC METABOLIC PANEL
Anion gap: 8 (ref 5–15)
BUN: 28 mg/dL — ABNORMAL HIGH (ref 8–23)
CO2: 28 mmol/L (ref 22–32)
Calcium: 9 mg/dL (ref 8.9–10.3)
Chloride: 103 mmol/L (ref 98–111)
Creatinine, Ser: 1.1 mg/dL (ref 0.61–1.24)
GFR, Estimated: >60 mL/min (ref >60)
Glucose, Bld: 92 mg/dL (ref 70–99)
Potassium: 4.4 mmol/L (ref 3.5–5.1)
Sodium: 139 mmol/L (ref 135–145)

## 2022-01-01 NOTE — ED Provider Notes (Signed)
[facility] Provider Note    Event Date/Time   First MD Initiated Contact with Patient 01/01/22 1545     (approximate)   History   took wrong medication   HPI  Ronnie Strickland is a 71 y.o. male   who accidentally took his wife's scheduled medications around 11 AM today medications included 12.5 mg spironolactone, 5 mg amlodipine, 100 mg losartan, 25 mg chlorthalidone, 40 mg pravastatin.  Patient states that he feels well.  No near syncope or lightheaded symptoms.  No numbness or tingling.  No chest pain or shortness of breath no nausea or vomiting.  Did not take any other medications at this time.      Physical Exam   Triage Vital Signs: ED Triage Vitals  Enc Vitals Group     BP 01/01/22 1440 115/84     Pulse Rate 01/01/22 1440 (!) 58     Resp 01/01/22 1440 18     Temp 01/01/22 1440 97.8 F (36.6 C)     Temp Source 01/01/22 1440 Oral     SpO2 01/01/22 1440 99 %     Weight 01/01/22 1441 180 lb 12.4 oz (82 kg)     Height 01/01/22 1441 6' (1.829 m)     Head Circumference --      Peak Flow --      Pain Score 01/01/22 1441 0     Pain Loc --      Pain Edu? --      Excl. in [place]? --     Most recent vital signs: Vitals:   01/01/22 1440  BP: 115/84  Pulse: (!) 58  Resp: 18  Temp: 97.8 F (36.6 C)  SpO2: 99%     Constitutional: Alert  Eyes: Conjunctivae are normal.  Head: Atraumatic. Nose: No congestion/rhinnorhea. Mouth/Throat: Mucous membranes are moist.   Neck: Painless ROM.  Cardiovascular:   Good peripheral circulation. Respiratory: Normal respiratory effort.  No retractions.  Gastrointestinal: Soft and nontender.  Musculoskeletal:  no deformity Neurologic:  MAE spontaneously. No gross focal neurologic deficits are appreciated.  Skin:  Skin is warm, dry and intact. No rash noted. Psychiatric: Mood and affect are normal. Speech and behavior are normal.    ED Results / Procedures / Treatments   Labs (all labs ordered are listed,  but only abnormal results are displayed) Labs Reviewed  CBC WITH DIFFERENTIAL/PLATELET - Abnormal; Notable for the following components:      Result Value   Platelets 106 (*)    All other components within normal limits  BASIC METABOLIC PANEL - Abnormal; Notable for the following components:   BUN 28 (*)    All other components within normal limits     EKG  ED ECG REPORT I, Merlyn Lot, the attending physician, personally viewed and interpreted this ECG.   Date: 01/01/2022  EKG Time: 14:58  Rate: 65  Rhythm: sinus  Axis: normal  Intervals:rbbb  ST&T Change: no stemi, no depressions    RADIOLOGY    PROCEDURES:  Critical Care performed: No  Procedures   MEDICATIONS ORDERED IN ED: Medications - No data to display   IMPRESSION / MDM / [place] / ED COURSE  I reviewed the triage vital signs and the nursing notes.                              Differential diagnosis includes, but is not limited to, accidental ingestion, medication  side effect, medication reaction, dehydration, AKI  Patient presented to the ER for evaluation of accidental ingestion of his wife's medications.  This presenting complaint could reflect a potentially life-threatening illness therefore the patient will be placed on continuous pulse oximetry and telemetry for monitoring.  Laboratory evaluation will be sent to evaluate for the above complaints.   EKG is nonischemic.  Blood work is reassuring.  I have consulted poison control. They have reviewed medications and states that there is no indication for additional observation.  Patient does appear stable and appropriate for outpatient follow-up at this time.      Patient's presentation is most consistent with acute presentation with potential threat to life or bodily function.   FINAL CLINICAL IMPRESSION(S) / ED DIAGNOSES   Final diagnoses:  Accidental drug ingestion, initial encounter     Rx / DC Orders   ED Discharge  Orders     None        Note:  This document was prepared using Dragon voice recognition software and may include unintentional dictation errors.    Merlyn Lot, MD 01/01/22 [phone]

## 2022-01-01 NOTE — ED Provider Triage Note (Signed)
Emergency Medicine Provider Triage Evaluation Note  Curley Hogen , a 71 y.o. male  was evaluated in triage.  Pt complains of accidentally taking the wrong meds. Patienot normally takes midodrine for low BP and accidentally took his wife's medications including pravastatin, losartan, chlorthalidone, spironolactone and amlodipine. He has no complaints.  Review of Systems  Positive: No complaints Negative: Dizzy, CP, SOB, abd pain  Physical Exam  BP 115/84 (BP Location: Left Arm)   Pulse (!) 58   Temp 97.8 F (36.6 C) (Oral)   Resp 18   Ht 6' (1.829 m)   Wt 82 kg   SpO2 99%   BMI 24.52 kg/m  Gen:   Awake, no distress   Resp:  Normal effort  MSK:   Moves extremities without difficulty  Other:    Medical Decision Making  Medically screening exam initiated at 2:47 PM.  Appropriate orders placed.  Keyron Pokorski was informed that the remainder of the evaluation will be completed by another provider, this initial triage assessment does not replace that evaluation, and the importance of remaining in the ED until their evaluation is complete.     Marquette Old, PA-C 01/01/22 1449

## 2022-01-01 NOTE — ED Triage Notes (Signed)
Pt here with no complaints, wife states that she had her own medications laid out to take this am but the patient took them accidentally. Pt did not take his own meds. Meds taken were pravastatin, losartan, chlorthalidone, spironolactone, amlodopine. Pt denies any issues. [facility] and was [facility] to come to [facility].

## 2022-01-01 NOTE — ED Notes (Signed)
Pt sitting up in bed, pt offers no complaints, resps even and unlabored, pt states that he accidentally took his spouses pills this am.

## 2022-01-21 ENCOUNTER — Other Ambulatory Visit: Payer: Self-pay

## 2022-01-21 ENCOUNTER — Encounter: Payer: Self-pay | Admitting: Emergency Medicine

## 2022-01-21 ENCOUNTER — Emergency Department
Admission: EM | Admit: 2022-01-21 | Discharge: 2022-01-21 | Disposition: A | Discharge status: Home / Self Care | Payer: No Typology Code available for payment source | Attending: Student in an Organized Health Care Education/Training Program | Admitting: Student in an Organized Health Care Education/Training Program

## 2022-01-21 ENCOUNTER — Emergency Department: Payer: No Typology Code available for payment source

## 2022-01-21 DIAGNOSIS — H66001 Acute suppurative otitis media without spontaneous rupture of ear drum, right ear: Secondary | ICD-10-CM | POA: Diagnosis not present

## 2022-01-21 DIAGNOSIS — Z8589 Personal history of malignant neoplasm of other organs and systems: Secondary | ICD-10-CM | POA: Diagnosis not present

## 2022-01-21 DIAGNOSIS — R519 Headache, unspecified: Secondary | ICD-10-CM | POA: Diagnosis present

## 2022-01-21 DIAGNOSIS — F0390 Unspecified dementia without behavioral disturbance: Secondary | ICD-10-CM | POA: Insufficient documentation

## 2022-01-21 LAB — CBC
HCT: 39.4 % (ref 39.0–52.0)
Hemoglobin: 13.3 g/dL (ref 13.0–17.0)
MCH: 32.4 pg (ref 26.0–34.0)
MCHC: 33.8 g/dL (ref 30.0–36.0)
MCV: 95.9 fL (ref 80.0–100.0)
Platelets: 81 10*3/uL — ABNORMAL LOW (ref 150–400)
RBC: 4.11 MIL/uL — ABNORMAL LOW (ref 4.22–5.81)
RDW: 12.5 % (ref 11.5–15.5)
WBC: 7.7 10*3/uL (ref 4.0–10.5)
nRBC: 0 % (ref 0.0–0.2)

## 2022-01-21 LAB — BASIC METABOLIC PANEL
Anion gap: 5 (ref 5–15)
BUN: 25 mg/dL — ABNORMAL HIGH (ref 8–23)
CO2: 30 mmol/L (ref 22–32)
Calcium: 9 mg/dL (ref 8.9–10.3)
Chloride: 105 mmol/L (ref 98–111)
Creatinine, Ser: 1.42 mg/dL — ABNORMAL HIGH (ref 0.61–1.24)
GFR, Estimated: 52 mL/min — ABNORMAL LOW (ref >60)
Glucose, Bld: 123 mg/dL — ABNORMAL HIGH (ref 70–99)
Potassium: 4 mmol/L (ref 3.5–5.1)
Sodium: 140 mmol/L (ref 135–145)

## 2022-01-21 MED ORDER — CEPHALEXIN 500 MG PO CAPS: 500.0000 mg | ORAL_CAPSULE | Freq: Two times a day (BID) | ORAL | 0 refills | Status: AC

## 2022-01-21 MED ORDER — CEPHALEXIN 500 MG PO CAPS
500.0000 mg | ORAL_CAPSULE | Freq: Once | ORAL | Status: AC
  Administered 2022-01-21: 500 mg via ORAL
  Filled 2022-01-21: qty 1

## 2022-01-21 MED ORDER — ACETAMINOPHEN 325 MG PO TABS
650.0000 mg | ORAL_TABLET | Freq: Once | ORAL | Status: AC
  Administered 2022-01-21: 650 mg via ORAL
  Filled 2022-01-21: qty 2

## 2022-01-21 NOTE — ED Triage Notes (Signed)
Nurse first note- Per [facility] pt coming from home, woke up this morning with right sided facial pain and headache. Hx of dementia. Wife gave pt a pill this morning for hypotension.

## 2022-01-21 NOTE — ED Triage Notes (Signed)
Pt here with a headache. Pt states head is tender on the right side. Pt denies N/V/D. Pt has hx of dementia.

## 2022-01-21 NOTE — ED Provider Notes (Signed)
[facility] Provider Note    Event Date/Time   First MD Initiated Contact with Patient 01/21/22 [phone]     (approximate)   History   Headache   HPI  Ronnie Strickland is a 71 y.o. 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  No fevers or chills.  Symptoms started this morning.  Denies any chest pain or shortness of breath.     Physical Exam   Triage Vital Signs: ED Triage Vitals  Enc Vitals Group     BP 01/21/22 0725 96/63     Pulse Rate 01/21/22 0725 76     Resp 01/21/22 0725 16     Temp 01/21/22 0725 98.6 F (37 C)     Temp Source 01/21/22 0725 Oral     SpO2 01/21/22 0720 96 %     Weight 01/21/22 0726 180 lb 12.4 oz (82 kg)     Height 01/21/22 0726 6' (1.829 m)     Head Circumference --      Peak Flow --      Pain Score 01/21/22 0726 4     Pain Loc --      Pain Edu? --      Excl. in [place]? --     Most recent vital signs: Vitals:   01/21/22 0720 01/21/22 0725  BP:  96/63  Pulse:  76  Resp:  16  Temp:  98.6 F (37 C)  SpO2: 96% 98%     Constitutional: Alert inNAD Eyes: Conjunctivae are normal.  Head: Atraumatic. Nose: No congestion/rhinnorhea. Mouth/Throat: Mucous membranes are moist.  Uvula midline tonsils normal bilaterally.  No trismus.  Normal phonation.  Some mild mastoid tenderness palpation the right.  Does have purulent right bulging effusion with erythematous TM. Neck: Painless ROM.  Cardiovascular:   Good peripheral circulation. Respiratory: Normal respiratory effort.  No retractions.  Gastrointestinal: Soft and nontender.  Musculoskeletal:  no deformity Neurologic:  MAE spontaneously. No gross focal neurologic deficits are appreciated.  Skin:  Skin is warm, dry and intact. No rash noted. Psychiatric: Mood and affect are normal. Speech and behavior are normal.    ED Results / Procedures / Treatments   Labs (all  labs ordered are listed, but only abnormal results are displayed) Labs Reviewed  CBC - Abnormal; Notable for the following components:      Result Value   RBC 4.11 (*)    Platelets 81 (*)    All other components within normal limits  BASIC METABOLIC PANEL - Abnormal; Notable for the following components:   Glucose, Bld 123 (*)    BUN 25 (*)    Creatinine, Ser 1.42 (*)    GFR, Estimated 52 (*)    All other components within normal limits     EKG     RADIOLOGY Please see ED Course for my review and interpretation.  I personally reviewed all radiographic images ordered to evaluate for the above acute complaints and reviewed radiology reports and findings.  These findings were personally discussed with the patient.  Please see medical record for radiology report.    PROCEDURES:  Critical Care performed: No  Procedures   MEDICATIONS ORDERED IN ED: Medications  cephALEXin (KEFLEX) capsule 500 mg (500 mg Oral Given 01/21/22 0854)  acetaminophen (TYLENOL) tablet 650 mg (650 mg Oral Given 01/21/22 0854)     IMPRESSION / MDM /  ASSESSMENT AND PLAN / ED COURSE  I reviewed the triage vital signs and the nursing notes.                              Differential diagnosis includes, but is not limited to, SDH, IPH, SAH, migraine, tension, cluster, AOM, AOE, mass  Patient presented to the ER for evaluation of symptoms as described above.  Given his age and history this presenting complaint could reflect a potentially life-threatening illness therefore the patient will be placed on continuous pulse oximetry and telemetry for monitoring.  Laboratory evaluation will be sent to evaluate for the above complaints.  He is fairly well-appearing however no focal neurodeficits.  Given his history CT imaging will be ordered to evaluate for the but differential but I have a high suspicion that his symptoms are related to right ear infection that the spouse reports patient's been taking topical  antibiotic drops per [place].   Clinical Course as of 01/21/22 0914  Tue Jan 21, 2022  0900 CT head on my review and interpretation does not show any evidence of hemorrhage. [PR]  0914 CT imaging stable per radiology.  He is well-appearing feels improved after Tylenol.  Is not consistent with SAH or bleed.  He is not septic.  Is tolerating p.o.  Appears clinically stable and appropriate for outpatient follow-up. [PR]    Clinical Course User Index [PR] Merlyn Lot, MD      FINAL CLINICAL IMPRESSION(S) / ED DIAGNOSES   Final diagnoses:  Acute suppurative otitis media of right ear without spontaneous rupture of tympanic membrane, recurrence not specified     Rx / DC Orders   ED Discharge Orders          Ordered    cephALEXin (KEFLEX) 500 MG capsule  2 times daily        01/21/22 0847             Note:  This document was prepared using Dragon voice recognition software and may include unintentional dictation errors.    Merlyn Lot, MD 01/21/22 [phone]

## 2023-02-21 ENCOUNTER — Emergency Department
Admission: EM | Admit: 2023-02-21 | Discharge: 2023-02-21 | Disposition: A | Discharge status: Home / Self Care | Payer: No Typology Code available for payment source | Attending: Emergency Medicine | Admitting: Emergency Medicine

## 2023-02-21 ENCOUNTER — Encounter: Payer: Self-pay | Admitting: Emergency Medicine

## 2023-02-21 DIAGNOSIS — I10 Essential (primary) hypertension: Secondary | ICD-10-CM | POA: Diagnosis not present

## 2023-02-21 DIAGNOSIS — T675XXA Heat exhaustion, unspecified, initial encounter: Secondary | ICD-10-CM

## 2023-02-21 DIAGNOSIS — R55 Syncope and collapse: Secondary | ICD-10-CM | POA: Diagnosis present

## 2023-02-21 DIAGNOSIS — F0390 Unspecified dementia without behavioral disturbance: Secondary | ICD-10-CM | POA: Insufficient documentation

## 2023-02-21 LAB — CBC
HCT: 37.6 % — ABNORMAL LOW (ref 39.0–52.0)
Hemoglobin: 12.6 g/dL — ABNORMAL LOW (ref 13.0–17.0)
MCH: 32.3 pg (ref 26.0–34.0)
MCHC: 33.5 g/dL (ref 30.0–36.0)
MCV: 96.4 fL (ref 80.0–100.0)
Platelets: 95 10*3/uL — ABNORMAL LOW (ref 150–400)
RBC: 3.9 MIL/uL — ABNORMAL LOW (ref 4.22–5.81)
RDW: 12.6 % (ref 11.5–15.5)
WBC: 4.9 10*3/uL (ref 4.0–10.5)
nRBC: 0 % (ref 0.0–0.2)

## 2023-02-21 LAB — COMPREHENSIVE METABOLIC PANEL
ALT: 13 U/L (ref 0–44)
AST: 17 U/L (ref 15–41)
Albumin: 3.4 g/dL — ABNORMAL LOW (ref 3.5–5.0)
Alkaline Phosphatase: 76 U/L (ref 38–126)
Anion gap: 7 (ref 5–15)
BUN: 28 mg/dL — ABNORMAL HIGH (ref 8–23)
CO2: 27 mmol/L (ref 22–32)
Calcium: 8.7 mg/dL — ABNORMAL LOW (ref 8.9–10.3)
Chloride: 107 mmol/L (ref 98–111)
Creatinine, Ser: 1.3 mg/dL — ABNORMAL HIGH (ref 0.61–1.24)
GFR, Estimated: 58 mL/min — ABNORMAL LOW (ref >60)
Glucose, Bld: 94 mg/dL (ref 70–99)
Potassium: 4.6 mmol/L (ref 3.5–5.1)
Sodium: 141 mmol/L (ref 135–145)
Total Bilirubin: 0.8 mg/dL (ref 0.3–1.2)
Total Protein: 6.2 g/dL — ABNORMAL LOW (ref 6.5–8.1)

## 2023-02-21 LAB — TROPONIN I (HIGH SENSITIVITY): Troponin I (High Sensitivity): 4 ng/L (ref <18)

## 2023-02-21 MED ORDER — SODIUM CHLORIDE 0.9 % IV BOLUS
500.0000 mL | Freq: Once | INTRAVENOUS | Status: AC
  Administered 2023-02-21: 500 mL via INTRAVENOUS

## 2023-02-21 NOTE — ED Provider Notes (Signed)
[facility] Provider Note    Event Date/Time   First MD Initiated Contact with Patient 02/21/23 1137     (approximate)   History   Weakness   HPI  Ronnie Strickland is a 72 y.o. male with a history of diabetes, dementia who presents after a near syncopal episode.  Patient was out doing some gardening in the heat and felt lightheaded.  He denies chest pain, no palpitations, feels at baseline now.     Physical Exam   Triage Vital Signs: ED Triage Vitals  Encounter Vitals Group     BP 02/21/23 1145 (!) 149/95     Systolic BP Percentile --      Diastolic BP Percentile --      Pulse Rate 02/21/23 1145 72     Resp 02/21/23 1145 14     Temp 02/21/23 1145 98.1 F (36.7 C)     Temp Source 02/21/23 1145 Oral     SpO2 02/21/23 1145 99 %     Weight --      Height --      Head Circumference --      Peak Flow --      Pain Score 02/21/23 1146 0     Pain Loc --      Pain Education --      Exclude from Growth Chart --     Most recent vital signs: Vitals:   02/21/23 1145  BP: (!) 149/95  Pulse: 72  Resp: 14  Temp: 98.1 F (36.7 C)  SpO2: 99%     General: Awake, no distress.  CV:  Good peripheral perfusion.  Regular rate and rhythm Resp:  Normal effort.  Clear to auscultation bilaterally Abd:  No distention.  Other:  No calf pain or swelling   ED Results / Procedures / Treatments   Labs (all labs ordered are listed, but only abnormal results are displayed) Labs Reviewed  CBC - Abnormal; Notable for the following components:      Result Value   RBC 3.90 (*)    Hemoglobin 12.6 (*)    HCT 37.6 (*)    Platelets 95 (*)    All other components within normal limits  COMPREHENSIVE METABOLIC PANEL - Abnormal; Notable for the following components:   BUN 28 (*)    Creatinine, Ser 1.30 (*)    Calcium 8.7 (*)    Total Protein 6.2 (*)    Albumin 3.4 (*)    GFR, Estimated 58 (*)    All other components within normal limits  TROPONIN I (HIGH  SENSITIVITY)     EKG  ED ECG REPORT I, Jene Every, the attending physician, personally viewed and interpreted this ECG.  Date: 02/21/2023  Rhythm: normal sinus rhythm QRS Axis: normal Intervals: normal ST/T Wave abnormalities: normal Narrative Interpretation: no evidence of acute ischemia        PROCEDURES:  Critical Care performed:   Procedures   MEDICATIONS ORDERED IN ED: Medications  sodium chloride 0.9 % bolus 500 mL (0 mLs Intravenous Stopped 02/21/23 1345)     IMPRESSION / MDM / ASSESSMENT AND PLAN / ED COURSE  I reviewed the triage vital signs and the nursing notes. Patient's presentation is most consistent with acute presentation with potential threat to life or bodily function.  Patient presents with near syncopal episode, differential includes dehydration, heat exhaustion, arrhythmia, less likely ACS  Asymptomatic here and well-appearing, vital signs reassuring.  EKG is unremarkable, high sensitive troponin is normal.  UA and creatinine ratio not significantly changed from prior, patient treated with IV fluids,  No indication for admission at this point, appropriate for discharge.        FINAL CLINICAL IMPRESSION(S) / ED DIAGNOSES   Final diagnoses:  Heat exhaustion, initial encounter     Rx / DC Orders   ED Discharge Orders     None        Note:  This document was prepared using Dragon voice recognition software and may include unintentional dictation errors.   Jene Every, MD 02/21/23 [phone]

## 2023-02-21 NOTE — ED Triage Notes (Signed)
Pt here from home with c/o heat exhaustion while working in the garden , hx of same , no fever , VSS

## 2023-03-13 ENCOUNTER — Other Ambulatory Visit: Payer: Self-pay

## 2023-03-13 ENCOUNTER — Emergency Department: Payer: No Typology Code available for payment source

## 2023-03-13 ENCOUNTER — Emergency Department
Admission: EM | Admit: 2023-03-13 | Discharge: 2023-03-14 | Discharge status: Against Medical Advice | Payer: No Typology Code available for payment source | Attending: Emergency Medicine | Admitting: Emergency Medicine

## 2023-03-13 DIAGNOSIS — Z5321 Procedure and treatment not carried out due to patient leaving prior to being seen by health care provider: Secondary | ICD-10-CM | POA: Diagnosis not present

## 2023-03-13 DIAGNOSIS — F0390 Unspecified dementia without behavioral disturbance: Secondary | ICD-10-CM | POA: Diagnosis not present

## 2023-03-13 DIAGNOSIS — I10 Essential (primary) hypertension: Secondary | ICD-10-CM | POA: Diagnosis not present

## 2023-03-13 DIAGNOSIS — R1010 Upper abdominal pain, unspecified: Secondary | ICD-10-CM | POA: Insufficient documentation

## 2023-03-13 DIAGNOSIS — R079 Chest pain, unspecified: Secondary | ICD-10-CM | POA: Insufficient documentation

## 2023-03-13 LAB — COMPREHENSIVE METABOLIC PANEL WITH GFR
ALT: 17 U/L (ref 0–44)
AST: 21 U/L (ref 15–41)
Albumin: 3.7 g/dL (ref 3.5–5.0)
Alkaline Phosphatase: 88 U/L (ref 38–126)
Anion gap: 9 (ref 5–15)
BUN: 31 mg/dL — ABNORMAL HIGH (ref 8–23)
CO2: 27 mmol/L (ref 22–32)
Calcium: 8.8 mg/dL — ABNORMAL LOW (ref 8.9–10.3)
Chloride: 104 mmol/L (ref 98–111)
Creatinine, Ser: 1.54 mg/dL — ABNORMAL HIGH (ref 0.61–1.24)
GFR, Estimated: 47 mL/min — ABNORMAL LOW
Glucose, Bld: 88 mg/dL (ref 70–99)
Potassium: 4.2 mmol/L (ref 3.5–5.1)
Sodium: 140 mmol/L (ref 135–145)
Total Bilirubin: 0.6 mg/dL (ref 0.3–1.2)
Total Protein: 6.7 g/dL (ref 6.5–8.1)

## 2023-03-13 LAB — CBC
HCT: 39.5 % (ref 39.0–52.0)
Hemoglobin: 12.9 g/dL — ABNORMAL LOW (ref 13.0–17.0)
MCH: 31.9 pg (ref 26.0–34.0)
MCHC: 32.7 g/dL (ref 30.0–36.0)
MCV: 97.5 fL (ref 80.0–100.0)
Platelets: 109 K/uL — ABNORMAL LOW (ref 150–400)
RBC: 4.05 MIL/uL — ABNORMAL LOW (ref 4.22–5.81)
RDW: 12.6 % (ref 11.5–15.5)
WBC: 5.6 K/uL (ref 4.0–10.5)
nRBC: 0 % (ref 0.0–0.2)

## 2023-03-13 LAB — TROPONIN I (HIGH SENSITIVITY): Troponin I (High Sensitivity): 4 ng/L (ref <18)

## 2023-03-13 NOTE — ED Triage Notes (Signed)
First Nurse Note:  BIB [facility] from home. Pt has dementia per family report. Pt reports chest pain to EMS, central and upper abd. HTN en route with no hx of same. Family reports pt typically hypotensive. No SOB noted or reported. No n/v en route.   EMS VS:  162/105 HR 85 Spo2 97% RA

## 2023-03-13 NOTE — ED Triage Notes (Signed)
Pt to ED via EMS from home, pt c/o left lower chest/rib pain that started earlier today. Pt denies accompanying symptoms. Pt has hx dementia

## 2023-04-20 ENCOUNTER — Other Ambulatory Visit: Payer: Self-pay

## 2023-04-20 ENCOUNTER — Emergency Department: Payer: No Typology Code available for payment source

## 2023-04-20 ENCOUNTER — Emergency Department
Admission: EM | Admit: 2023-04-20 | Discharge: 2023-04-20 | Disposition: A | Discharge status: Home / Self Care | Payer: No Typology Code available for payment source | Attending: Emergency Medicine | Admitting: Emergency Medicine

## 2023-04-20 DIAGNOSIS — S0990XA Unspecified injury of head, initial encounter: Secondary | ICD-10-CM

## 2023-04-20 DIAGNOSIS — S0181XA Laceration without foreign body of other part of head, initial encounter: Secondary | ICD-10-CM | POA: Diagnosis not present

## 2023-04-20 DIAGNOSIS — W01198A Fall on same level from slipping, tripping and stumbling with subsequent striking against other object, initial encounter: Secondary | ICD-10-CM | POA: Insufficient documentation

## 2023-04-20 DIAGNOSIS — Z23 Encounter for immunization: Secondary | ICD-10-CM | POA: Diagnosis not present

## 2023-04-20 LAB — CBC
HCT: 38.2 % — ABNORMAL LOW (ref 39.0–52.0)
Hemoglobin: 12.6 g/dL — ABNORMAL LOW (ref 13.0–17.0)
MCH: 31.7 pg (ref 26.0–34.0)
MCHC: 33 g/dL (ref 30.0–36.0)
MCV: 96.2 fL (ref 80.0–100.0)
Platelets: 99 10*3/uL — ABNORMAL LOW (ref 150–400)
RBC: 3.97 MIL/uL — ABNORMAL LOW (ref 4.22–5.81)
RDW: 12.5 % (ref 11.5–15.5)
WBC: 5.7 10*3/uL (ref 4.0–10.5)
nRBC: 0 % (ref 0.0–0.2)

## 2023-04-20 LAB — BASIC METABOLIC PANEL
Anion gap: 5 (ref 5–15)
BUN: 24 mg/dL — ABNORMAL HIGH (ref 8–23)
CO2: 30 mmol/L (ref 22–32)
Calcium: 9 mg/dL (ref 8.9–10.3)
Chloride: 104 mmol/L (ref 98–111)
Creatinine, Ser: 1.3 mg/dL — ABNORMAL HIGH (ref 0.61–1.24)
GFR, Estimated: 58 mL/min — ABNORMAL LOW (ref >60)
Glucose, Bld: 104 mg/dL — ABNORMAL HIGH (ref 70–99)
Potassium: 5.4 mmol/L — ABNORMAL HIGH (ref 3.5–5.1)
Sodium: 139 mmol/L (ref 135–145)

## 2023-04-20 MED ORDER — TETANUS-DIPHTH-ACELL PERTUSSIS 5-2.5-18.5 LF-MCG/0.5 IM SUSY
0.5000 mL | PREFILLED_SYRINGE | Freq: Once | INTRAMUSCULAR | Status: AC
  Administered 2023-04-20: 0.5 mL via INTRAMUSCULAR
  Filled 2023-04-20: qty 0.5

## 2023-04-20 NOTE — ED Triage Notes (Signed)
Pt comes via EMS from home with c/o unwitnessed fall. Family heard loud thud and found pt on the floor. Pt is at baseline but family unsure if pt hit his head or any loc. Pt has lac to nose with bleeding controlled. Pt states neck and back pain.  Pt placed on stretcher in triage.

## 2023-04-20 NOTE — ED Provider Notes (Signed)
[facility] Provider Note    Event Date/Time   First MD Initiated Contact with Patient 04/20/23 1639     (approximate)   History   Fall   HPI  Ronnie Strickland is a 72 y.o. male who presents today for evaluation after a mechanical fall.  Patient reports he lost his balance.  He reports that he hit his head and sustained a laceration to his nose.  Patient reports that he has been waiting for 4 hours and his pain has resolved.  He does not take anticoagulation.  Family reports that he is at his baseline.  No chest pain, shortness of breath, abdominal pain, back pain, extremity pain, headache or neck pain.  He has not had any vomiting.  He still able to ambulate.  Unsure of last tetanus shot.  Patient Active Problem List   Diagnosis Date Noted   Dysarthria 10/17/2016   Dysphagia    Gastritis and gastroduodenitis    Special screening for malignant neoplasms, colon    Benign neoplasm of transverse colon    Right ankle sprain 04/21/2013          Physical Exam   Triage Vital Signs: ED Triage Vitals  Encounter Vitals Group     BP 04/20/23 1314 106/79     Systolic BP Percentile --      Diastolic BP Percentile --      Pulse Rate 04/20/23 1314 60     Resp 04/20/23 1314 16     Temp 04/20/23 1314 98.5 F (36.9 C)     Temp Source 04/20/23 1314 Oral     SpO2 04/20/23 1314 96 %     Weight --      Height --      Head Circumference --      Peak Flow --      Pain Score 04/20/23 1255 5     Pain Loc --      Pain Education --      Exclude from Growth Chart --     Most recent vital signs: Vitals:   04/20/23 1314 04/20/23 1749  BP: 106/79 109/77  Pulse: 60 65  Resp: 16 16  Temp: 98.5 F (36.9 C)   SpO2: 96% 96%    Physical Exam Vitals and nursing note reviewed.  Constitutional:      General: Awake and alert. No acute distress.    Appearance: Normal appearance. The patient is normal weight.  HENT:     Head: Normocephalic.  Superficial laceration  to nasal bridge, no active bleeding    Mouth: Mucous membranes are moist.  Dried blood in right nare, no septal hematoma Eyes:     General: PERRL. Normal EOMs        Right eye: No discharge.        Left eye: No discharge.     Conjunctiva/sclera: Conjunctivae normal.  Cardiovascular:     Rate and Rhythm: Normal rate and regular rhythm.     Pulses: Normal pulses.  Pulmonary:     Effort: Pulmonary effort is normal. No respiratory distress.     Breath sounds: Normal breath sounds.  Abdominal:     Abdomen is soft. There is no abdominal tenderness. No rebound or guarding. No distention. Musculoskeletal:        General: No swelling. Normal range of motion.     Cervical back: Normal range of motion and neck supple. No midline cervical spine tenderness.  Full range of motion of neck.  Negative  Spurling test.  Negative Lhermitte sign.  Normal strength and sensation in bilateral upper extremities. Normal grip strength bilaterally.  Normal intrinsic muscle function of the hand bilaterally.  Normal radial pulses bilaterally. Back: No midline vertebral tenderness. Strength and sensation 5/5 to bilateral lower extremities. Normal great toe extension against resistance. Normal sensation throughout feet. Pelvis stable Skin:    General: Skin is warm and dry.     Capillary Refill: Capillary refill takes less than 2 seconds.     Findings: No rash.  Neurological:     Mental Status: The patient is awake and alert.   Neurological: GCS 15 alert and oriented x3 Normal speech, no expressive or receptive aphasia or dysarthria Cranial nerves II through XII intact Normal visual fields 5 out of 5 strength in all 4 extremities with intact sensation throughout No extremity drift Normal finger-to-nose testing, no limb or truncal ataxia   ED Results / Procedures / Treatments   Labs (all labs ordered are listed, but only abnormal results are displayed) Labs Reviewed  BASIC METABOLIC PANEL - Abnormal; Notable  for the following components:      Result Value   Potassium 5.4 (*)    Glucose, Bld 104 (*)    BUN 24 (*)    Creatinine, Ser 1.30 (*)    GFR, Estimated 58 (*)    All other components within normal limits  CBC - Abnormal; Notable for the following components:   RBC 3.97 (*)    Hemoglobin 12.6 (*)    HCT 38.2 (*)    Platelets 99 (*)    All other components within normal limits  URINALYSIS, ROUTINE W REFLEX MICROSCOPIC  CBG MONITORING, ED     EKG     RADIOLOGY I independently reviewed and interpreted imaging and agree with radiologists findings.     PROCEDURES:  Critical Care performed:   Marland KitchenMarland KitchenLaceration Repair  Date/Time: 04/20/2023 4:57 PM  Performed by: Jackelyn Hoehn, PA-C Authorized by: Jackelyn Hoehn, PA-C   Consent:    Consent obtained:  Verbal   Consent given by:  Patient and spouse   Risks, benefits, and alternatives were discussed: yes     Risks discussed:  Infection, need for additional repair, nerve damage, poor cosmetic result, poor wound healing, pain, retained foreign body, tendon damage and vascular damage   Alternatives discussed:  No treatment Universal protocol:    Procedure explained and questions answered to patient or proxy's satisfaction: yes     Relevant documents present and verified: yes     Test results available: yes     Imaging studies available: yes     Required blood products, implants, devices, and special equipment available: yes     Site/side marked: yes     Immediately prior to procedure, a time out was called: yes     Patient identity confirmed:  Verbally with patient Anesthesia:    Anesthesia method:  None Laceration details:    Location:  Face   Face location:  Nose   Length (cm):  0.8   Depth (mm):  0.5 Pre-procedure details:    Preparation:  Patient was prepped and draped in usual sterile fashion and imaging obtained to evaluate for foreign bodies Exploration:    Limited defect created (wound extended): no      Hemostasis achieved with:  Direct pressure   Imaging outcome: foreign body not noted     Wound exploration: wound explored through full range of motion and entire depth of wound visualized  Wound extent: areolar tissue not violated, fascia not violated, no foreign body, no signs of injury, no nerve damage, no tendon damage, no underlying fracture and no vascular damage     Contaminated: no   Treatment:    Area cleansed with:  Chlorhexidine and saline   Amount of cleaning:  Standard   Irrigation method:  Pressure wash   Debridement:  None   Undermining:  None   Scar revision: no   Skin repair:    Repair method:  Tissue adhesive Approximation:    Approximation:  Close Repair type:    Repair type:  Simple Post-procedure details:    Dressing:  Non-adherent dressing    MEDICATIONS ORDERED IN ED: Medications  Tdap (BOOSTRIX) injection 0.5 mL (0.5 mLs Intramuscular Given 04/20/23 1707)     IMPRESSION / MDM / ASSESSMENT AND PLAN / ED COURSE  I reviewed the triage vital signs and the nursing notes.   Differential diagnosis includes, but is not limited to, facial laceration, contusion, intracranial hemorrhage, cervical spine injury, facial fracture.  Patient is awake and alert, hemodynamically stable and afebrile.  He is nontoxic in appearance.  He has a superficial laceration to his nasal bridge without septal hematoma.  There is no facial fracture noted on his CT scans.  There is no intracranial hemorrhage or cervical spine injury.  His cervical collar was removed and he has full and normal range of motion of his neck without pain.  There are no radicular symptoms or weakness/paresthesias in his arms.  He has normal strength and sensation in his bilateral upper extremities, normal grip strength, do not suspect cord compression or central cord syndrome or other cervical spine acute injury.  He has no chest wall tenderness or abdominal tenderness.  He has not had any dysuria or hematuria.   Labs are overall reassuring with the exception of mild hyperkalemia, no EKG changes.  No AKI.  I recommended that he follow-up with his outpatient provider for recheck.  H&H is stable.  His laceration was cleaned and repaired with skin glue given that it is small and superficial.  We discussed wound care.  His tetanus was updated.  We discussed return precautions and the importance of close outpatient follow-up.  He is able to ambulate with a steady gait, and is at his mental baseline according to his wife.  He has no pain otherwise, denies back pain which was currently noted in the triage note.  Patient and wife understand and agree with plan.  He was discharged in stable condition.   Patient's presentation is most consistent with acute complicated illness / injury requiring diagnostic workup.   FINAL CLINICAL IMPRESSION(S) / ED DIAGNOSES   Final diagnoses:  Injury of head, initial encounter  Facial laceration, initial encounter     Rx / DC Orders   ED Discharge Orders     None        Note:  This document was prepared using Dragon voice recognition software and may include unintentional dictation errors.   Keturah Shavers 04/20/23 1759    Minna Antis, MD 04/20/23 2217

## 2023-04-20 NOTE — Discharge Instructions (Signed)
Your CT scans are normal.  Your laceration was repaired with glue.  Your tetanus was updated.  Please follow-up with your outpatient provider.  Please return for any new, worsening, or change in symptoms or other concerns.  It was a pleasure caring for you today.

## 2023-04-24 ENCOUNTER — Emergency Department
Admission: EM | Admit: 2023-04-24 | Discharge: 2023-04-25 | Disposition: A | Discharge status: Home / Self Care | Payer: No Typology Code available for payment source | Attending: Emergency Medicine | Admitting: Emergency Medicine

## 2023-04-24 ENCOUNTER — Emergency Department: Payer: Non-veteran care

## 2023-04-24 ENCOUNTER — Other Ambulatory Visit: Payer: Self-pay

## 2023-04-24 DIAGNOSIS — R4182 Altered mental status, unspecified: Secondary | ICD-10-CM | POA: Diagnosis present

## 2023-04-24 DIAGNOSIS — Z8521 Personal history of malignant neoplasm of larynx: Secondary | ICD-10-CM | POA: Insufficient documentation

## 2023-04-24 DIAGNOSIS — F0390 Unspecified dementia without behavioral disturbance: Secondary | ICD-10-CM | POA: Diagnosis not present

## 2023-04-24 DIAGNOSIS — Z1152 Encounter for screening for COVID-19: Secondary | ICD-10-CM | POA: Insufficient documentation

## 2023-04-24 DIAGNOSIS — N179 Acute kidney failure, unspecified: Secondary | ICD-10-CM | POA: Diagnosis not present

## 2023-04-24 LAB — CBC WITH DIFFERENTIAL/PLATELET
Abs Immature Granulocytes: 0.03 10*3/uL (ref 0.00–0.07)
Basophils Absolute: 0 10*3/uL (ref 0.0–0.1)
Basophils Relative: 0 %
Eosinophils Absolute: 0 10*3/uL (ref 0.0–0.5)
Eosinophils Relative: 1 %
HCT: 37.1 % — ABNORMAL LOW (ref 39.0–52.0)
Hemoglobin: 12.5 g/dL — ABNORMAL LOW (ref 13.0–17.0)
Immature Granulocytes: 0 %
Lymphocytes Relative: 4 %
Lymphs Abs: 0.3 10*3/uL — ABNORMAL LOW (ref 0.7–4.0)
MCH: 31.8 pg (ref 26.0–34.0)
MCHC: 33.7 g/dL (ref 30.0–36.0)
MCV: 94.4 fL (ref 80.0–100.0)
Monocytes Absolute: 0.4 10*3/uL (ref 0.1–1.0)
Monocytes Relative: 5 %
Neutro Abs: 7.4 10*3/uL (ref 1.7–7.7)
Neutrophils Relative %: 90 %
Platelets: 105 10*3/uL — ABNORMAL LOW (ref 150–400)
RBC: 3.93 MIL/uL — ABNORMAL LOW (ref 4.22–5.81)
RDW: 12.4 % (ref 11.5–15.5)
WBC: 8.2 10*3/uL (ref 4.0–10.5)
nRBC: 0 % (ref 0.0–0.2)

## 2023-04-24 LAB — PROTIME-INR
INR: 1.1 (ref 0.8–1.2)
Prothrombin Time: 14.8 s (ref 11.4–15.2)

## 2023-04-24 MED ORDER — LACTATED RINGERS IV BOLUS (SEPSIS)
1000.0000 mL | Freq: Once | INTRAVENOUS | Status: AC
Start: 2023-04-25 — End: 2023-04-25
  Administered 2023-04-24: 1000 mL via INTRAVENOUS

## 2023-04-24 NOTE — ED Triage Notes (Signed)
Pt family reports increased confusion through the day today. Pt has hx of dementia but family reports increased confusion. Pt was seen Monday for fall and has wound to nose. Family reports high fevers at home. Pt alert and oriented to self.

## 2023-04-24 NOTE — ED Triage Notes (Signed)
First Nurse Note:  BIB [facility] from home. EMS was called out for ear hemorrhage and nasal infection. Upon their arrival dried scab noted to ear that family reported was obtained while getting tympanic temp. Pt also has injury to nose from where he fell 4 days ago. Pt has hx of dementia. Breathing unlabored.   EMS VS:  92 HR  96% RA 98.8 oral  145/66

## 2023-04-25 LAB — URINALYSIS, W/ REFLEX TO CULTURE (INFECTION SUSPECTED)
Bacteria, UA: NONE SEEN
Bilirubin Urine: NEGATIVE
Glucose, UA: NEGATIVE mg/dL
Hgb urine dipstick: NEGATIVE
Ketones, ur: NEGATIVE mg/dL
Leukocytes,Ua: NEGATIVE
Nitrite: NEGATIVE
Protein, ur: NEGATIVE mg/dL
RBC / HPF: 0 RBC/hpf (ref 0–5)
Specific Gravity, Urine: 1.024 (ref 1.005–1.030)
Squamous Epithelial / HPF: 0 /[HPF] (ref 0–5)
pH: 5 (ref 5.0–8.0)

## 2023-04-25 LAB — RESP PANEL BY RT-PCR (RSV, FLU A&B, COVID)  RVPGX2
Influenza A by PCR: NEGATIVE
Influenza B by PCR: NEGATIVE
Resp Syncytial Virus by PCR: NEGATIVE
SARS Coronavirus 2 by RT PCR: NEGATIVE

## 2023-04-25 LAB — COMPREHENSIVE METABOLIC PANEL
ALT: 13 U/L (ref 0–44)
AST: 18 U/L (ref 15–41)
Albumin: 3.9 g/dL (ref 3.5–5.0)
Alkaline Phosphatase: 90 U/L (ref 38–126)
Anion gap: 6 (ref 5–15)
BUN: 35 mg/dL — ABNORMAL HIGH (ref 8–23)
CO2: 27 mmol/L (ref 22–32)
Calcium: 8.9 mg/dL (ref 8.9–10.3)
Chloride: 106 mmol/L (ref 98–111)
Creatinine, Ser: 1.48 mg/dL — ABNORMAL HIGH (ref 0.61–1.24)
GFR, Estimated: 49 mL/min — ABNORMAL LOW (ref >60)
Glucose, Bld: 120 mg/dL — ABNORMAL HIGH (ref 70–99)
Potassium: 4.4 mmol/L (ref 3.5–5.1)
Sodium: 139 mmol/L (ref 135–145)
Total Bilirubin: 0.8 mg/dL (ref 0.3–1.2)
Total Protein: 6.9 g/dL (ref 6.5–8.1)

## 2023-04-25 LAB — LACTIC ACID, PLASMA: Lactic Acid, Venous: 1.2 mmol/L (ref 0.5–1.9)

## 2023-04-25 LAB — LIPASE, BLOOD: Lipase: 38 U/L (ref 11–51)

## 2023-04-25 NOTE — ED Provider Notes (Signed)
[facility] Provider Note    Event Date/Time   First MD Initiated Contact with Patient 04/24/23 2343     (approximate)   History   Altered Mental Status  Level 5 caveat: History may be limited by the patient's chronic dementia.  History is provided primarily by the patient's adult granddaughter who is a caregiver, and by his wife.  HPI Nolin Granier is a 72 y.o. male whose history includes dementia and a prior history of throat cancer treated at [facility] and the [place].  He has some resulting bone damage for which he is supposed to have surgery next month.  He presents tonight with concerns for fever and increased altered mental status from baseline.  Although he has dementia, he is generally high functioning, but tonight he was acting bizarre, trying to go to bed in the incorrect rooms of the house, even trying to lie down on the floor to go to sleep.  He reportedly had a fever of 104 at home which was checked with a tympanic thermometer.  When EMS arrived, they got a temperature of 101, so he has been 99.3 rectal for [place].  The patient is awake and alert at this time and is denying any symptoms except for some pain in his lower abdomen/pelvis.  He said it is uncomfortable when he urinates.  He denies chest pain and shortness of breath and denies nausea and vomiting.     Physical Exam   Triage Vital Signs: ED Triage Vitals  Encounter Vitals Group     BP 04/24/23 2327 (!) 85/53     Systolic BP Percentile --      Diastolic BP Percentile --      Pulse Rate 04/24/23 2327 84     Resp 04/24/23 2333 20     Temp 04/24/23 2327 99.9 F (37.7 C)     Temp Source 04/24/23 2327 Oral     SpO2 04/24/23 2327 95 %     Weight 04/24/23 2328 74.8 kg (165 lb)     Height --      Head Circumference --      Peak Flow --      Pain Score 04/24/23 2328 0     Pain Loc --      Pain Education --      Exclude from Growth Chart --     Most recent vital signs: Vitals:   04/25/23 0200  04/25/23 0300  BP: 103/70 108/70  Pulse: 86 85  Resp: 14 16  Temp:    SpO2: 100% 100%    General: Awake, alert, oriented to self and his family and his location.  No obvious distress. CV:  Good peripheral perfusion.  Regular rate and rhythm. Resp:  Normal effort. Speaking easily and comfortably, no accessory muscle usage nor intercostal retractions.  Lungs clear to auscultation. Abd:  No distention.  Minimal tenderness to palpation in the suprapubic region but no rebound or guarding.  No upper abdominal tenderness.   ED Results / Procedures / Treatments   Labs (all labs ordered are listed, but only abnormal results are displayed) Labs Reviewed  COMPREHENSIVE METABOLIC PANEL - Abnormal; Notable for the following components:      Result Value   Glucose, Bld 120 (*)    BUN 35 (*)    Creatinine, Ser 1.48 (*)    GFR, Estimated 49 (*)    All other components within normal limits  CBC WITH DIFFERENTIAL/PLATELET - Abnormal; Notable for the following components:  RBC 3.93 (*)    Hemoglobin 12.5 (*)    HCT 37.1 (*)    Platelets 105 (*)    Lymphs Abs 0.3 (*)    All other components within normal limits  URINALYSIS, W/ REFLEX TO CULTURE (INFECTION SUSPECTED) - Abnormal; Notable for the following components:   Color, Urine YELLOW (*)    APPearance CLEAR (*)    All other components within normal limits  RESP PANEL BY RT-PCR (RSV, FLU A&B, COVID)  [id number]  CULTURE, BLOOD (ROUTINE X 2)  CULTURE, BLOOD (ROUTINE X 2)  LACTIC ACID, PLASMA  PROTIME-INR  LIPASE, BLOOD  LACTIC ACID, PLASMA      RADIOLOGY I viewed and interpreted the patient's chest x-ray and there is no evidence of acute or emergent abnormality such as pneumonia or pneumothorax.  I also read the radiologist's report, which confirmed no acute findings.   PROCEDURES:  Critical Care performed: No  .1-3 Lead EKG Interpretation  Performed by: Loleta Rose, MD Authorized by: Loleta Rose, MD     Interpretation:  normal     ECG rate:  82   ECG rate assessment: normal     Rhythm: sinus rhythm     Ectopy: none     Conduction: normal       IMPRESSION / MDM / ASSESSMENT AND PLAN / ED COURSE  I reviewed the triage vital signs and the nursing notes.                              Differential diagnosis includes, but is not limited to, UTI, electrolyte or metabolic abnormality including dehydration or renal dysfunction, pneumonia, less likely intracranial hemorrhage or CVA.  Patient's presentation is most consistent with acute presentation with potential threat to life or bodily function.  Labs/studies ordered: Urinalysis, lactic acid, CBC with differential, blood cultures x 2, pro time-INR as part of the sepsis protocol, lipase, CMP, respiratory viral panel  Interventions/Medications given:  Medications  lactated ringers bolus 1,000 mL (0 mLs Intravenous Stopped 04/25/23 0119)    (Note:  hospital course my include additional interventions and/or labs/studies not listed above.)  The patient's blood pressure initially was low at 85/53 but after 1 L fluid is come up to normal.  He has no specific complaints or concerns other than some suprapubic discomfort and dysuria, so I am awaiting urinalysis.  His labs are generally reassuring with no sign of sepsis other than the initial low blood pressure; he has no leukocytosis, no tachycardia nor tachypnea, no hypoxia, normal lactic acid.  I will reassess after the full workup is complete.  The patient is on the cardiac monitor to evaluate for evidence of arrhythmia and/or significant heart rate changes.   Clinical Course as of 04/25/23 0309  Sat Apr 25, 2023  0136 Urinalysis, w/ Reflex to Culture (Infection Suspected) -Urine, Clean Catch(!) Normal urinalysis [CF]  0137 Ordering oral rehydration protocol to determine patient's ability to tolerate oral intake [CF]  0205 Resp panel by RT-PCR (RSV, Flu A&B, Covid) Urine, Catheterized [CF]  0307 Reassessed  patient.  He is looking and feeling better than he was before.  His family comments that he "perked up" after fluids.  We went over the results and the fact that his evaluation has been very reassuring.  He may have a mild viral illness which led to the fever at home but he has not had a measured fever in the ED.  I considered hospitalization given  his initial episode of hypotension and his mild AKI, but he is very stable and alert right now and his family would rather take him home which I think is very appropriate.  I gave strict return precautions and they agree with the plan.  The patient shook my hand and was joking with me prior to my departure from the room and overall I believe this is the best plan for the patient. [CF]    Clinical Course User Index [CF] Loleta Rose, MD     FINAL CLINICAL IMPRESSION(S) / ED DIAGNOSES   Final diagnoses:  AKI (acute kidney injury) (HCC)     Rx / DC Orders   ED Discharge Orders     None        Note:  This document was prepared using Dragon voice recognition software and may include unintentional dictation errors.   Loleta Rose, MD 04/25/23 [id number]

## 2023-04-25 NOTE — Discharge Instructions (Signed)
Your workup in the Emergency Department today was reassuring.  We did not find any specific abnormalities.  We recommend you drink plenty of fluids, take your regular medications and/or any new ones prescribed today, and follow up with the doctor(s) listed in these documents as recommended.  Return to the Emergency Department if you develop new or worsening symptoms that concern you.  

## 2023-04-29 LAB — CULTURE, BLOOD (ROUTINE X 2)
Culture: NO GROWTH
Culture: NO GROWTH
Special Requests: ADEQUATE
Special Requests: ADEQUATE

## 2023-05-18 NOTE — H&P (Addendum)
 The history and physical has been reviewed and the patient has been examined. There are no changes in the overall assessment and no contraindication for surgery. I have reviewed the goals and expectations again. He and/or family member has had further opportunity to ask questions, all of which have been answered to the best of my ability. We again reviewed the risks which include, but are not limited to bleeding, infection, the need for revision, asymmetry, aesthetic concern, injury to related or nearby structures; including the potential for permanent/temporary injury to the nerves which supply motor and/or sensory function to the head and neck region. Alternative treatment modalities, including the potential of no operative treatment, specifically discussed with the patient.  Informed consent was obtained in both written as well as in verbal form.  The patient is aware that my portion of this surgical procedure will be determined based on the area of resection accomplished by Dr. Kaylie.  Potential treatments could range from rotational muscle interpositional flap; alloplastic material placement to serve as interpositional barrier or condylectomy based on resection of the articulating fossa.  Patient states understanding and is aware the final determination of treatment cannot be predicted until Dr. Kaylie has completed his resection of the affected tissues.

## 2023-05-18 NOTE — Procedures (Signed)
 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]    * Chronic dental infection [K04.7]    * Submental abscess [L02.01]    * Dental caries [521.0]    * Periodontal disease [K05.6]   PROCEDURES: CRANIOMAXILLOFACIAL EXAMINATION OF TEMPOROMANDIBULAR JOINT UNDER GENERAL ANESTHESIA (Right) SURGICAL EXTRACTION TEETH #5, #13 (ANALOGOUS TO DENTAL BILLING CODE [phone]  X 2) (Bilateral) EXTRACTION, ERUPTED TEETH #7, #8, #23, #24, #25, #26 (ANALOGOUS TO DENTAL BILLING CODE I2879 X 6) (Bilateral) INTRAORAL INCISION AND DRAINAGE OF ABSCESS FLOOR OF MOUTH; SUBMENTAL SPACE (Bilateral) BILATERAL MAXILLARY AND MANDIIBULAR ANTERIOR QUADRANT ALVEOLOPLASTY (CPT 58125 X 2) (Bilateral)  SURGEON: Surgeons and Role:    * Powers, Alm Pride, MD - Primary    * Fitzsimmons, Baird Blare, [place] - Assistant  ASSISTANT(S): None  STAFF: Circulator: Gray Setter, RN; Lennard Christopher MATSU, RN Scrub Person: Gray Setter, RN; Dasie Emmalene SQUIBB, RN; Tanda Calamity, RN  ANESTHESIA: General   INDICATION(S): Ronnie Strickland is a 72 year old male referred by Dr. 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  Additionally the [facility] requested multiple dental extractions as a component of the treatment today as he will already be under a general anesthetic.  OPERATIVE FINDINGS: As above   OPERATIVE REPORT:  After proper identification of the patient, verification of surgical sites and the completion of a time-out, the patient was prepped and draped in the standard fashion for sterile extraoral and intraoral surgical  procedures. An appropriate level of anesthesia was obtained via oral endotracheal intubation. Appropriate perioperative antibiotics were administered. The first component of surgery was accomplished by Dr. Alm Brand Otology.  Please refer to Dr. Elda separate operation report for the details of that procedure.  After the completion of Dr. Elda initial debridement/resection of irradiated bone, I was called to the room to perform a comprehensive examination of the operative site and evaluate for potential involvement of the right temporomandibular joint (TMJ).  Under microscopic visualization, a comprehensive craniomaxillofacial examination under general anesthesia was conducted which confirmed what appeared to be healthy bleeding bone in the right periauricular/mastoid regions.  There was no obvious involvement of the right TMJ nor violation of the right TMJ capsule.  The patient was returned to the care of Dr. Kaylie for the completion of his portion of the surgical procedure.  At the completion of Dr. Elda surgery, I returned and a full thickness mucoperiosteal flap was accomplished distal to tooth #5 with a #15 scalpel, which transitioned to an intrasulcular incision terminating at the mesial of tooth #13. After elevation of the flap with a #9 Molt periosteal elevator and removal of facial bone with an osteotome and a carbide bur in a rotary handpiece, grossly carious erupted teeth #5 and #13 were identified and surgically removed with standard elevator technique. The socket was curetted free of debris and the mucoperiosteal flap was repositioned.  Carious erupted teeth #7 and #8 were then identified and removed utilizing standard elevator and forceps technique. An anterior maxillary quadrant alveoloplasty procedure was accomplished with rongeurs and a rotary carbide bur in a surgical handpiece.  After copious irrigation with saline, and packing of the extraction sites with Surgicel, primary  closure of  the surgical sites was accomplished with interrupted 3-0 chromic sutures.  Next, a full thickness mucoperiosteal flap was designed extending ffull thickness mucoperiosteal flap was accomplished distal to tooth #22 with a #15 scalpel, which transitioned to an intrasulcular incision terminating at the mesial of tooth #28. After elevation of the flap with a #9 Molt periosteal elevator, grossly carious/periodontally hopeless erupted teeth #23, #24, #25 and #26  were identified and removed with standard elevator technique. Purulent discharge was evident from the extraction sites of #24 and #25, concerning for a submental space abscess.  The submental space was accessed intraorally with a Molt periosteal elevator and copious irrigation with saline was accomplished until the irrigant was clear, consistent with adequate treatment of the submental space infection.  A final rinse of the area was accomplished with saline, and a bilateral anterior mandibular quadrant alveoloplasty procedure was accomplished with rongeurs and a rotary carbide bur in a surgical handpiece.  After copious irrigation with saline, and packing of the extraction sites with Surgicel, primary closure of the surgical sites was accomplished with interrupted 3-0 chromic sutures. The posterior pharyngeal pack was removed after copious irrigation with saline. The patient was monitored for recovery and taken to the recovery room with stable vital signs.    ESTIMATED BLOOD LOSS: less than 50 mL   * No values recorded between 05/18/2023 11:31 AM and 05/18/2023  1:50 PM *   TOTAL IV FLUIDS: Please refer to Anesthesia records  SPECIMENS:  ID Type Source Tests Collected by Time Destination  1 : RIght ear canal tissue Tissue-Pathology TISSUE OTHER PATHOLOGY - GENERAL / OTHER Kaylie, David Marcus, MD 05/18/2023 1217      IMPLANTS:  Implant Name Type Inv. Item Serial No. Manufacturer Lot No. LRB No. Used Action  GRAFT, REPAIR OTOLOGIC  BIODESIGN 2.5X2.5 - [id number]  GRAFT, REPAIR OTOLOGIC BIODESIGN 2.5X2.5  COOK/SURGERY [id number] Right 1 Implanted     COMPLICATIONS: None  DISPOSITION: PACU - hemodynamically stable.  ATTESTATION:  TP- Surgery (With Asst Surgeon) - I personally performed the procedure with the assistance of Laurin Cahill-Fitzsimmons, PA as an assistant surgeon/assistant at surgery as a qualified resident was not available for this procedure (TP).

## 2023-05-18 NOTE — Procedures (Signed)
 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]    * Chronic dental infection [K04.7]    * Submental abscess [L02.01]    * Dental caries [521.0]    * Periodontal disease [K05.6]   Procedure(s): TYMPANOPLASTY WITH ANTROTOMY OR MASTOIDOTOMY (INCLUDING CANALPLASTY, ATTICOTOMY, MIDDLE EAR SURGERY, AND/OR TYMPANIC MEMBRANE REPAIR); WITHOUT OSSICULAR CHAIN RECONSTRUCTION (Right) MICROSURGICAL TECHNIQUES, REQUIRING USE OF OPERATING MICROSCOPE (LIST IN ADDITION TO PRIMARY PROCEDURE) (Right) CRANIOMAXILLOFACIAL EXAMINATION OF TEMPOROMANDIBULAR JOINT UNDER GENERAL ANESTHESIA (Right) SURGICAL EXTRACTION TEETH #5, #13, (ANALOGOUS TO DENTAL BILLING CODE [phone]  X 2) (Bilateral) EXTRACTION, ERUPTED TEETH #7, #8, #23, #24, #25, #26 (ANALOGOUS TO DENTAL BILLING CODE I2879 X 6) (Bilateral) INTRAORAL INCISION AND DRAINAGE OF ABSCESS FLOOR OF MOUTH; SUBMENTAL SPACE (Bilateral) BILATERAL MAXILLARY AND MANDIIBULAR ANTERIOR QUADRANT ALVEOLOPLASTY (CPT 58125 X 2) (Bilateral)  SURGEON: Surgeons and Role: Panel 1:    * Beatris Alm Lame, MD - Primary    * Del Risco, Alan Maus, MD - Resident - Assisting    * Pratson, Signe CROME, MD - Resident - Assisting Panel 2:    * Powers, Alm Pride, MD - Primary    * Fitzsimmons, Baird Blare, [place] - Assistant  ASSISTANT(S): None  STAFF: Circulator: Gray Setter, RN; Lennard Christopher MATSU, RN Scrub Person: Gray Setter, RN; Dasie Emmalene SQUIBB, RN; Tanda Calamity, RN  ANESTHESIA: General   INDICATION(S):  79 M with a history of a 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   OPERATIVE FINDINGS:  1) Exposed non-viable bone and mucosalized skin of the anterior, inferior external auditory canal. Canalplasty  performed to debride nonviable bone. Temporomandibular joint not encountered 2) Posterior 20% TM perforation. Middle ear well aerated and ossicles intact. Repaired with biodesign using a medial graft technique  OPERATIVE REPORT:   Patient was greeted in the preoperative holding room open procedure informed consent was obtained.  All questions were answered.  The planned operative site was marked.  Patient was taken the operating room and laid supine where successful general endotracheal anesthesia was induced Without neuromuscular paralysis. Surgical timeout was taken confirming patient, procedure and key personnel.  The table was rotated 180 degrees away from anesthesia.  The postauricular sulcus was injected with 5 cc of 1% lidocaine  with 1 100,000 epinephrine which was allowed time to take effect.  The facial nerve monitor was registered and found to be functioning appropriately.  The patient was prepped and draped in a sterile fashion.  Attention was then turned to the right canalplasty.  Using the microscope, and the external auditory canal and tympanic membrane were inspected with findings as noted above.  1 cc of lidocaine  with 1 100,000 epinephrine were injected into the external auditory canal skin.  Vascular strip incisions were made.  Attention was turned posteriorly where the previously marked incision was entered sharply and dissection carried down to the loose areolar fascia.  Dissection was carried forward in an avascular plane until the external auditory canal skin was encountered.  The periosteum was incised in a T fashion and released from the mastoid cortex.  The vascular strip incision was reflected laterally and held forward with the ear and self-retaining retractors.  Using a 3 mm diamond and micro instruments, mucosalized skin and nonviable bone were removed from the anterior and inferior external auditory canal.  Dr. Smith examined the patient and did not note any entry into the  temporomandibular joint, also it was decided that no condylar resection or reconstruction was needed.  The tympanic membrane was elevated and the middle ear entered with findings as noted above.  A bio design graft was fashioned to serve as an underlay for tympanic membrane reconstruction as well as the graft for the canal skin defect.  The middle ear was packed with Gelfoam.  The graft was placed and positioned appropriately.  The native drum was laid over the graft.  The periosteum was closed with 3-0 Vicryl.  The postauricular incision was closed with 4-0 Vicryl.  The vascular strip was laid back in an anatomic position and the external auditory canal filled with bacitracin.  A Glasscock dressing was applied.     Care of the patient was turned to Dr. Smith for dental extractions, the details of which will be dictated in a separate operative note.   ESTIMATED BLOOD LOSS: Minimal   * No values recorded between 05/18/2023 11:31 AM and 05/18/2023  1:50 PM *      TOTAL IV FLUIDS: Per anesthesia  SPECIMENS:  ID Type Source Tests Collected by Time Destination  1 : RIght ear canal tissue Tissue-Pathology TISSUE OTHER PATHOLOGY - GENERAL / OTHER Kaylie, David Marcus, MD 05/18/2023 1217      IMPLANTS:  Implant Name Type Inv. Item Serial No. Manufacturer Lot No. LRB No. Used Action  GRAFT, REPAIR OTOLOGIC BIODESIGN 2.5X2.5 - [id number]  GRAFT, REPAIR OTOLOGIC BIODESIGN 2.5X2.5  COOK/SURGERY [id number] Right 1 Implanted     COMPLICATIONS: None  DISPOSITION: PACU - hemodynamically stable.  ATTESTATION:  FR- Surgery (Key Portions) - I was personally present during the key and critical portions of this procedure and immediately available throughout the entire procedure.  I have reviewed and agree with the procedural note as documented by the resident (FR).

## 2023-05-22 ENCOUNTER — Emergency Department: Payer: No Typology Code available for payment source

## 2023-05-22 ENCOUNTER — Observation Stay
Admission: EM | Admit: 2023-05-22 | Discharge: 2023-05-22 | Disposition: A | Discharge status: Home / Self Care | Payer: No Typology Code available for payment source | Attending: Internal Medicine | Admitting: Internal Medicine

## 2023-05-22 DIAGNOSIS — R791 Abnormal coagulation profile: Secondary | ICD-10-CM | POA: Diagnosis not present

## 2023-05-22 DIAGNOSIS — E785 Hyperlipidemia, unspecified: Secondary | ICD-10-CM | POA: Insufficient documentation

## 2023-05-22 DIAGNOSIS — Z87891 Personal history of nicotine dependence: Secondary | ICD-10-CM | POA: Diagnosis not present

## 2023-05-22 DIAGNOSIS — Z96659 Presence of unspecified artificial knee joint: Secondary | ICD-10-CM | POA: Insufficient documentation

## 2023-05-22 DIAGNOSIS — Z85818 Personal history of malignant neoplasm of other sites of lip, oral cavity, and pharynx: Secondary | ICD-10-CM | POA: Insufficient documentation

## 2023-05-22 DIAGNOSIS — F0390 Unspecified dementia without behavioral disturbance: Secondary | ICD-10-CM | POA: Insufficient documentation

## 2023-05-22 DIAGNOSIS — F0393 Unspecified dementia, unspecified severity, with mood disturbance: Secondary | ICD-10-CM | POA: Diagnosis not present

## 2023-05-22 DIAGNOSIS — Z79899 Other long term (current) drug therapy: Secondary | ICD-10-CM | POA: Insufficient documentation

## 2023-05-22 DIAGNOSIS — R4781 Slurred speech: Secondary | ICD-10-CM | POA: Diagnosis not present

## 2023-05-22 DIAGNOSIS — I951 Orthostatic hypotension: Secondary | ICD-10-CM | POA: Diagnosis not present

## 2023-05-22 DIAGNOSIS — M8730 Other secondary osteonecrosis, unspecified bone: Secondary | ICD-10-CM | POA: Diagnosis not present

## 2023-05-22 DIAGNOSIS — H903 Sensorineural hearing loss, bilateral: Secondary | ICD-10-CM | POA: Insufficient documentation

## 2023-05-22 DIAGNOSIS — Z9889 Other specified postprocedural states: Secondary | ICD-10-CM | POA: Insufficient documentation

## 2023-05-22 DIAGNOSIS — M272 Inflammatory conditions of jaws: Secondary | ICD-10-CM | POA: Diagnosis not present

## 2023-05-22 DIAGNOSIS — Y842 Radiological procedure and radiotherapy as the cause of abnormal reaction of the patient, or of later complication, without mention of misadventure at the time of the procedure: Secondary | ICD-10-CM

## 2023-05-22 DIAGNOSIS — M1990 Unspecified osteoarthritis, unspecified site: Secondary | ICD-10-CM | POA: Insufficient documentation

## 2023-05-22 DIAGNOSIS — R404 Transient alteration of awareness: Secondary | ICD-10-CM

## 2023-05-22 DIAGNOSIS — R4182 Altered mental status, unspecified: Secondary | ICD-10-CM | POA: Diagnosis present

## 2023-05-22 DIAGNOSIS — Z85858 Personal history of malignant neoplasm of other endocrine glands: Secondary | ICD-10-CM | POA: Diagnosis not present

## 2023-05-22 LAB — CBC WITH DIFFERENTIAL/PLATELET
Abs Immature Granulocytes: 0.03 10*3/uL (ref 0.00–0.07)
Basophils Absolute: 0 10*3/uL (ref 0.0–0.1)
Basophils Relative: 0 %
Eosinophils Absolute: 0.2 10*3/uL (ref 0.0–0.5)
Eosinophils Relative: 3 %
HCT: 41.2 % (ref 39.0–52.0)
Hemoglobin: 13.7 g/dL (ref 13.0–17.0)
Immature Granulocytes: 1 %
Lymphocytes Relative: 10 %
Lymphs Abs: 0.6 10*3/uL — ABNORMAL LOW (ref 0.7–4.0)
MCH: 32.1 pg (ref 26.0–34.0)
MCHC: 33.3 g/dL (ref 30.0–36.0)
MCV: 96.5 fL (ref 80.0–100.0)
Monocytes Absolute: 0.3 10*3/uL (ref 0.1–1.0)
Monocytes Relative: 5 %
Neutro Abs: 5.4 10*3/uL (ref 1.7–7.7)
Neutrophils Relative %: 81 %
Platelets: 105 10*3/uL — ABNORMAL LOW (ref 150–400)
RBC: 4.27 MIL/uL (ref 4.22–5.81)
RDW: 12.6 % (ref 11.5–15.5)
WBC: 6.6 10*3/uL (ref 4.0–10.5)
nRBC: 0 % (ref 0.0–0.2)

## 2023-05-22 LAB — URINALYSIS, W/ REFLEX TO CULTURE (INFECTION SUSPECTED)
Bilirubin Urine: NEGATIVE
Glucose, UA: NEGATIVE mg/dL
Hgb urine dipstick: NEGATIVE
Ketones, ur: NEGATIVE mg/dL
Leukocytes,Ua: NEGATIVE
Nitrite: NEGATIVE
Protein, ur: NEGATIVE mg/dL
Specific Gravity, Urine: 1.012 (ref 1.005–1.030)
Squamous Epithelial / HPF: 0 /[HPF] (ref 0–5)
pH: 8 (ref 5.0–8.0)

## 2023-05-22 LAB — COMPREHENSIVE METABOLIC PANEL
ALT: 14 U/L (ref 0–44)
AST: 18 U/L (ref 15–41)
Albumin: 3.7 g/dL (ref 3.5–5.0)
Alkaline Phosphatase: 79 U/L (ref 38–126)
Anion gap: 8 (ref 5–15)
BUN: 17 mg/dL (ref 8–23)
CO2: 28 mmol/L (ref 22–32)
Calcium: 9 mg/dL (ref 8.9–10.3)
Chloride: 104 mmol/L (ref 98–111)
Creatinine, Ser: 1.13 mg/dL (ref 0.61–1.24)
GFR, Estimated: >60 mL/min (ref >60)
Glucose, Bld: 91 mg/dL (ref 70–99)
Potassium: 4.2 mmol/L (ref 3.5–5.1)
Sodium: 140 mmol/L (ref 135–145)
Total Bilirubin: 0.9 mg/dL (ref <1.2)
Total Protein: 7 g/dL (ref 6.5–8.1)

## 2023-05-22 LAB — PROTIME-INR
INR: 1.1 (ref 0.8–1.2)
Prothrombin Time: 14.2 s (ref 11.4–15.2)

## 2023-05-22 LAB — LACTIC ACID, PLASMA: Lactic Acid, Venous: 1 mmol/L (ref 0.5–1.9)

## 2023-05-22 LAB — APTT: aPTT: 30 s (ref 24–36)

## 2023-05-22 MED ORDER — ACETAMINOPHEN 325 MG RE SUPP: 650.0000 mg | Freq: Four times a day (QID) | RECTAL | Status: DC | PRN

## 2023-05-22 MED ORDER — ONDANSETRON HCL 4 MG PO TABS: 4.0000 mg | ORAL_TABLET | Freq: Four times a day (QID) | ORAL | Status: DC | PRN

## 2023-05-22 MED ORDER — FENTANYL CITRATE PF 50 MCG/ML IJ SOSY
25.0000 ug | PREFILLED_SYRINGE | INTRAMUSCULAR | Status: DC | PRN
  Administered 2023-05-22: 25 ug via INTRAVENOUS
  Filled 2023-05-22: qty 1

## 2023-05-22 MED ORDER — ACETAMINOPHEN 325 MG PO TABS: 650.0000 mg | ORAL_TABLET | Freq: Four times a day (QID) | ORAL | Status: DC | PRN

## 2023-05-22 MED ORDER — ONDANSETRON HCL 4 MG/2ML IJ SOLN: 4.0000 mg | Freq: Four times a day (QID) | INTRAMUSCULAR | Status: DC | PRN

## 2023-05-22 MED ORDER — POLYETHYLENE GLYCOL 3350 17 G PO PACK
17.0000 g | PACK | Freq: Every day | ORAL | Status: DC | PRN
Start: 2023-05-22 — End: 2023-05-23

## 2023-05-22 MED ORDER — ENOXAPARIN SODIUM 40 MG/0.4ML IJ SOSY: 40.0000 mg | PREFILLED_SYRINGE | INTRAMUSCULAR | Status: DC

## 2023-05-22 NOTE — ED Triage Notes (Signed)
Pt family called EMS at 1200 today stating pt was not responding to them. Pts vitals are stable for EMS and pt is able to respond slowly verbally. Family reports pt had previously had salivary gland cancer and his Rt side of jaw had become necrotic needing a procedure. This procedure was reportedly done Monday, pt was not sent home with abx. Family concerned for infection.

## 2023-05-22 NOTE — ED Provider Notes (Signed)
[facility] Provider Note    Event Date/Time   First MD Initiated Contact with Patient 05/22/23 1238     (approximate)   History   Altered Mental Status (Family reports pt has been slow to respond.)   HPI  Ronnie Strickland is a 72 y.o. male status post recent ENT surgery discharged home on antibiotics presents to the ER for evaluation of 16 to 24 hours of altered mental status confusion.  Felt like he has been warm to touch.  Noted decreased p.o. intake     Physical Exam   Triage Vital Signs: ED Triage Vitals  Encounter Vitals Group     BP 05/22/23 1251 (!) 148/95     Systolic BP Percentile --      Diastolic BP Percentile --      Pulse Rate 05/22/23 1251 76     Resp --      Temp 05/22/23 1251 98.1 F (36.7 C)     Temp Source 05/22/23 1251 Oral     SpO2 05/22/23 1243 98 %     Weight 05/22/23 1255 165 lb (74.8 kg)     Height 05/22/23 1255 6\' 1"  (1.854 m)     Head Circumference --      Peak Flow --      Pain Score --      Pain Loc --      Pain Education --      Exclude from Growth Chart --     Most recent vital signs: Vitals:   05/22/23 1249 05/22/23 1251  BP:  (!) 148/95  Pulse:  76  Temp:  98.1 F (36.7 C)  SpO2: 94% 94%     Constitutional: Alert  Eyes: Conjunctivae are normal.  Head: Atraumatic. Nose: No congestion/rhinnorhea. Mouth/Throat: Mucous membranes are moist.   Neck: Painless ROM.  Cardiovascular:   Good peripheral circulation. Respiratory: Normal respiratory effort.  No retractions.  Gastrointestinal: Soft and nontender.  Musculoskeletal:  no deformity Neurologic:  MAE spontaneously.  Sensation intact bilaterally.  Does seem to have some dysarthric speech.  Fused and slow to respond. Skin:  Skin is warm, dry and intact. No rash noted. Psychiatric: Mood and affect are normal. Speech and behavior are normal.    ED Results / Procedures / Treatments   Labs (all labs ordered are listed, but only abnormal results are  displayed) Labs Reviewed  CBC WITH DIFFERENTIAL/PLATELET - Abnormal; Notable for the following components:      Result Value   Platelets 105 (*)    Lymphs Abs 0.6 (*)    All other components within normal limits  LACTIC ACID, PLASMA  COMPREHENSIVE METABOLIC PANEL  PROTIME-INR  APTT  LACTIC ACID, PLASMA  URINALYSIS, W/ REFLEX TO CULTURE (INFECTION SUSPECTED)     EKG  ED ECG REPORT I, Willy Eddy, the attending physician, personally viewed and interpreted this ECG.   Date: 05/22/2023  EKG Time: 12:56  Rate: 80  Rhythm: sinus  Axis: normal  Intervals:rbbb  ST&T Change: no stemi, no depressions    RADIOLOGY Please see ED Course for my review and interpretation.  I personally reviewed all radiographic images ordered to evaluate for the above acute complaints and reviewed radiology reports and findings.  These findings were personally discussed with the patient.  Please see medical record for radiology report.    PROCEDURES:  Critical Care performed: No  Procedures   MEDICATIONS ORDERED IN ED: Medications  fentaNYL (SUBLIMAZE) injection 25 mcg (has no administration in time  range)     IMPRESSION / MDM / ASSESSMENT AND PLAN / ED COURSE  I reviewed the triage vital signs and the nursing notes.                              Differential diagnosis includes, but is not limited to, Dehydration, sepsis, pna, uti, hypoglycemia, cva, drug effect, withdrawal, encephalitis  Patient presenting to the ER for evaluation of symptoms as described above.  Based on symptoms, risk factors and considered above differential, this presenting complaint could reflect a potentially life-threatening illness therefore the patient will be placed on continuous pulse oximetry and telemetry for monitoring.  Laboratory evaluation will be sent to evaluate for the above complaints.      Clinical Course as of 05/22/23 1510  Fri May 22, 2023  1456 Blood work is reassuring no leukocytosis no  lactate elevation.  CT head on my review and interpretation without evidence of IPH or SDH.  Given his altered mental status age and risk factors I will order MRI to further evaluate for CVA as that remains on the differential.  Will consult hospitalist for admission as family do not feel that they can manage him at home. [PR]    Clinical Course User Index [PR] Willy Eddy, MD     FINAL CLINICAL IMPRESSION(S) / ED DIAGNOSES   Final diagnoses:  Altered mental status, unspecified altered mental status type  Slurred speech     Rx / DC Orders   ED Discharge Orders     None        Note:  This document was prepared using Dragon voice recognition software and may include unintentional dictation errors.    Willy Eddy, MD 05/22/23 [phone]

## 2023-05-22 NOTE — Assessment & Plan Note (Signed)
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  This is unlikely to be contributing to current presentation.

## 2023-05-22 NOTE — Assessment & Plan Note (Signed)
-   Continue home midodrine

## 2023-05-22 NOTE — Assessment & Plan Note (Signed)
Per patient's wife, he has a history of dementia and easily gets disoriented even at home.  He tends to be confused at times as well.  - Continue home memantine

## 2023-05-22 NOTE — Assessment & Plan Note (Addendum)
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  He had surgical repair about 4 days ago at [facility].  Incision appears clean and to be healing well.  Associated inflammation seen on CT imaging earlier today.  - Tylenol for pain control - Avoid opioids given increase risk of delirium

## 2023-05-22 NOTE — Discharge Instructions (Addendum)
Ronnie Strickland,   Ronnie Strickland was seen in the ED due to altered mental status.  Our workup has been reassuring including a normal chest x-ray and CT+MRI of the brain.  We could see on the scans that there are expected postsurgical changes around the right ear.  Ronnie Strickland blood work is also reassuring, with no electrolyte, kidney, or liver abnormalities.  His blood counts are within normal limits, with the exception of his platelets which are at his baseline.  The likely cause of Ronnie Strickland symptoms are delirium after having surgery.  I have attached a PDF with further information for you to read.  If Ronnie Strickland has additional episodes of altered mental status or is unable to wake up, please return to the ER.  In addition, if he noticed any increasing redness or discharge from around his incision, please call your surgeons at [facility].  Sincerely,  Dr. Huel Cote (Dr. B)

## 2023-05-22 NOTE — Assessment & Plan Note (Addendum)
Patient is presenting with transient altered mental status that included slow to respond and increased disorientation.  Primary differential at this point is delirium in the setting of recent surgery and associated acute pain.  Symptoms have completely resolved and patient is back to baseline.  MRI negative. Stable for discharge.

## 2023-05-22 NOTE — ED Notes (Signed)
Bed placement called and informed them that the pt will go home after MRI results. Dr. Huel Cote messaged as well

## 2023-05-22 NOTE — H&P (Signed)
History and Physical    Patient: Ronnie Strickland ZOX:[id number] DOB: [birth year] DOA: 05/22/2023 DOS: the patient was seen and examined on 05/22/2023 [facility]: [facility]  Patient coming from: Home  Chief Complaint:  Chief Complaint  Patient presents with   Altered Mental Status    Family reports pt has been slow to respond.   HPI: Ronnie Strickland is a 72 y.o. 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  History obtained from patient's wife at bedside due to patient's underlying dementia.  She states that since Ronnie Strickland had his surgery 4 days ago, he has been doing well overall but has been complaining of a headache.  In addition, he has been experiencing intermittent hallucinations.  Yesterday evening, he seemed very tired and she put him to bed around 8 PM.  This morning at 10 AM, he was still asleep and she was trying to wake him up.  She noticed that he was very difficult to awake, and when he awoke he seemed very slow to respond.  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  She denies noticing any focal weakness.  His last dose of oxycodone was at 6 AM on 11/21.  At this time, Mr. Corris endorses headache and some dizziness but denies any vision changes, chest pain, palpitations, shortness of breath or focal weakness.  Ronnie Strickland states that patient has dementia at baseline and he often gets disoriented in his own home.  At this time, he seems back to his baseline.  ED course: On arrival to the ED, patient was hypertensive at 148/95, with heart rate of 83.  He was saturating at 94% on room air.  He  was afebrile at 98.1. Initial workup demonstrates stable thrombocytopenia with platelets of 105, but otherwise normal CBC and CMP.  Lactic acid within normal limits.  UA with rare bacteria but no evidence of leukocytes, nitrites or WBCs.  CT of the head was obtained that demonstrated moderate right mastoid effusion but no acute intracranial abnormality.  Chest x-ray with no active disease.  For further evaluation, [facility] contacted for admission.  Review of Systems: As mentioned in the history of present illness. All other systems reviewed and are negative.  Past Medical History:  Diagnosis Date   Diabetes mellitus type 2, diet-controlled (HCC)    GERD (gastroesophageal reflux disease)    Gout    H/O knee surgery    Hyperlipidemia    Oral cancer [facility])    Past Surgical History:  Procedure Laterality Date   COLONOSCOPY     COLONOSCOPY WITH PROPOFOL N/A 03/21/2016   Procedure: COLONOSCOPY WITH PROPOFOL;  Surgeon: Ronnie Minium, MD;  Location[facility];  Service: Endoscopy;  Laterality: N/A;   ESOPHAGOGASTRODUODENOSCOPY (EGD) WITH PROPOFOL N/A 03/21/2016   Procedure: ESOPHAGOGASTRODUODENOSCOPY (EGD) WITH PROPOFOL;  Surgeon: Ronnie Minium, MD;  Location: [facility];  Service: Endoscopy;  Laterality: N/A;  diabetic - diet controlled   KNEE SURGERY     VASECTOMY     Social History:  reports that he quit smoking about 39 years ago. His smoking use included cigarettes. He has never used smokeless tobacco. He reports current alcohol use. He reports that he does not  use drugs.  Allergies  Allergen Reactions   Tomato Hives    Family History  Problem Relation Age of Onset   Heart disease Mother    Arthritis Mother     Prior to Admission medications   Medication Sig Start Date End Date Taking? Authorizing Provider  ARTIFICIAL SALIVA MT Use as directed 1 application in the mouth or throat every 4 (four) hours as needed (dry mouth).    [provider]  atorvastatin (LIPITOR)  80 MG tablet Take 40 mg by mouth daily.     [provider]  Cholecalciferol (VITAMIN D3) 1000 units CAPS Take 2,000 Units by mouth daily.     [provider]  diclofenac sodium (VOLTAREN) 1 % GEL Apply 4 g topically 3 (three) times daily as needed (pain).    [provider]  fludrocortisone (FLORINEF) 0.1 MG tablet Take 0.1 mg by mouth daily.    [provider]  latanoprost (XALATAN) 0.005 % ophthalmic solution Place 1 drop into both eyes at bedtime.    [provider]  midodrine (PROAMATINE) 5 MG tablet Take 10 mg by mouth 3 (three) times daily with meals.    [provider]  nystatin (MYCOSTATIN) 100000 UNIT/ML suspension Take 10 mLs by mouth 2 (two) times daily. Swish and swallow    [provider]  omeprazole (PRILOSEC) 20 MG capsule Take 20 mg by mouth 2 (two) times daily before a meal.    [provider]  oxyCODONE (OXY IR/ROXICODONE) 5 MG immediate release tablet Take 5 mg by mouth every 6 (six) hours as needed for severe pain.    [provider]  QUEtiapine (SEROQUEL) 100 MG tablet Take 100 mg by mouth at bedtime. Take 100mg  at bedtime for 14 days. Then take 50mg  at bedtime for 7 days. Then stop. 10/04/16   [provider]  sertraline (ZOLOFT) 100 MG tablet Take 200 mg by mouth daily.     [provider]    Physical Exam: Vitals:   05/22/23 1500 05/22/23 1530 05/22/23 1600 05/22/23 1630  BP: (!) 130/94 (!) 133/98    Pulse: 80 85 86 82  Resp: 15 14 15 14   Temp:      TempSrc:      SpO2: 100% 98% 99% 95%  Weight:      Height:       Physical Exam Vitals and nursing note reviewed.  Constitutional:      General: He is not in acute distress.    Appearance: He is not ill-appearing.  HENT:     Mouth/Throat:     Mouth: Mucous membranes are dry.     Pharynx: Oropharynx is clear.  Eyes:     Pupils: Pupils are equal, round, and reactive to light.  Cardiovascular:     Rate and Rhythm:  Normal rate and regular rhythm.  Pulmonary:     Effort: Pulmonary effort is normal. No respiratory distress.     Breath sounds: Normal breath sounds.  Abdominal:     General: Bowel sounds are normal. There is no distension.     Palpations: Abdomen is soft.     Tenderness: There is no abdominal tenderness.  Musculoskeletal:     Right lower leg: No edema.     Left lower leg: No edema.  Skin:    General: Skin is warm and dry.  Neurological:     Mental Status: He is alert.     Comments:  No facial asymmetry or notable dysarthria Sensation grossly  intact throughout 5 out of 5 strength throughout Normal finger-to-nose bilaterally Intermittently has difficulty following multistep instructions  Psychiatric:        Mood and Affect: Affect is flat.        Speech: Speech is delayed.        Cognition and Memory: Cognition is impaired.    Data Reviewed: CBC with WBC of 6.6, hemoglobin of 13.7, platelets of 105 CMP with sodium of 140, potassium 4.2, bicarb 20, glucose 91, BUN 17, creat 1.13, AST 18, ALT 14, GFR above 60 Lactic acid 1.0 INR 1.1 Urinalysis with no leukocytes, nitrites, hematuria but rare bacteria.  Acute personally reviewed.  Sinus rhythm with rate of 78.  Singular PVC noted.  Right bundle branch block.  No changes noted compared to EKG obtained in October 2024.  CT HEAD WO CONTRAST ( )  Result Date: 05/22/2023 CLINICAL DATA:  Mental status change.  Unknown cause. EXAM: CT HEAD WITHOUT CONTRAST TECHNIQUE: Contiguous axial images were obtained from the base of the skull through the vertex without intravenous contrast. RADIATION DOSE REDUCTION: This exam was performed according to the departmental dose-optimization program which includes automated exposure control, adjustment of the mA and/or kV according to patient size and/or use of iterative reconstruction technique. COMPARISON:  CT scan head from 04/20/2023. FINDINGS: Brain: No evidence of acute infarction, hemorrhage,  hydrocephalus, extra-axial collection or mass lesion/mass effect. Otherwise normal appearance of brain parenchyma. Ventricles are normal. Cerebral volume is age appropriate. Vascular: No hyperdense vessel or unexpected calcification. Intracranial arteriosclerosis. Skull: Normal. Negative for fracture or focal lesion. Sinuses/Orbits: No acute finding. Mild mucoperiosteal thickening noted in bilateral maxillary sinus. No air-fluid levels. Other: There is moderate right mastoid effusion, new since the prior study. Overlying bone cortex is intact. Correlate clinically to exclude acute mastoiditis. Left-sided mastoid air cells are unremarkable. No mastoid effusion. IMPRESSION: *No acute intracranial abnormality. Electronically Signed   By: Jules Schick M.D.   On: 05/22/2023 14:02   DG Chest Port 1 View  Result Date: 05/22/2023 CLINICAL DATA:  Questionable sepsis - evaluate for abnormality. Altered mental status. EXAM: PORTABLE CHEST 1 VIEW COMPARISON:  04/24/2023. FINDINGS: Bilateral lung fields are clear. Bilateral costophrenic angles are clear. Normal cardio-mediastinal silhouette. No acute osseous abnormalities. The soft tissues are within normal limits. IMPRESSION: *No active disease. Electronically Signed   By: Jules Schick M.D.   On: 05/22/2023 13:57    MRI brain pending  Results are pending, will review when available.  Assessment and Plan:  * Altered awareness, transient Patient is presenting with transient altered mental status that included slow to respond and increased disorientation.  Primary differential at this point is delirium in the setting of recent surgery and associated acute pain.  Symptoms have completely resolved and patient is back to baseline.  MRI has been ordered to rule out intracranial abnormality, however suspicion is low.  - MRI brain pending - Delirium precautions - Seroquel at bedtime - PT in the a.m.  Dementia [facility]) Per patient's wife, he has a history of  dementia and easily gets disoriented even at home.  He tends to be confused at times as well.  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  He had surgical repair about 4 days ago at [facility].  Incision appears clean and to be healing well.  Associated inflammation seen on CT imaging earlier today.  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  This is unlikely to be contributing to current presentation.  Advance Care Planning:   Code Status: Full Code confirmed by patient's wife  Consults: None  Family Communication: Patient's wife updated at bedside  Severity of Illness: The appropriate patient status for this patient is OBSERVATION. Observation status is judged to be reasonable and necessary in order to provide the required intensity of service to ensure the patient's safety. The patient's presenting symptoms, physical exam findings, and initial radiographic and laboratory data in the context of their medical condition is felt to place them at decreased risk for further clinical deterioration. Furthermore, it is anticipated that the patient will be medically stable for discharge from the hospital within 2 midnights of admission.   Author: Verdene Lennert, MD 05/22/2023 5:35 PM  For on call review [email or web address].

## 2023-05-22 NOTE — Discharge Summary (Signed)
Physician Discharge Summary   Patient: Ronnie Strickland MRN: [id number] DOB: February 02, 1951  Admit date:     05/22/2023  Discharge date: 05/22/23  Discharge Physician: Verdene Lennert   PCP: [facility]   Recommendations at discharge:  Please follow-up with your PCP and [facility]  Discharge Diagnoses: Principal Problem:   Altered awareness, transient Active Problems:   Dementia (HCC)   Osteoradionecrosis (HCC)   Orthostatic hypotension   Hx of malignant neoplasm of parotid gland  Resolved Problems:   * No resolved hospital problems. [facility] Course: No notes on file  Assessment and Plan:  * Altered awareness, transient Patient is presenting with transient altered mental status that included slow to respond and increased disorientation.  Primary differential at this point is delirium in the setting of recent surgery and associated acute pain.  Symptoms have completely resolved and patient is back to baseline.  MRI negative. Stable for discharge.   Dementia [facility]) Per patient's wife, he has a history of dementia and easily gets disoriented even at home.  He tends to be confused at times as well.  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  He had surgical repair about 4 days ago at [facility].  Incision appears clean and to be healing well.  Associated inflammation seen on CT imaging earlier today.  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  This is unlikely to be contributing to current presentation.   Consultants: None Procedures performed: None Disposition: Home  DISCHARGE MEDICATION: Allergies as of 05/22/2023       Reactions    Tomato Hives        Medication List     TAKE these medications    ARTIFICIAL SALIVA MT Use as directed 1 application in the mouth or throat every 4 (four) hours as needed (dry mouth).   atorvastatin 80 MG tablet Commonly known as: LIPITOR Take 40 mg by mouth daily.   diclofenac sodium 1 % Gel Commonly known as: VOLTAREN Apply 4 g topically 3 (three) times daily as needed (pain).   fludrocortisone 0.1 MG tablet Commonly known as: FLORINEF Take 0.1 mg by mouth daily.   latanoprost 0.005 % ophthalmic solution Commonly known as: XALATAN Place 1 drop into both eyes at bedtime.   midodrine 5 MG tablet Commonly known as: PROAMATINE Take 10 mg by mouth 3 (three) times daily with meals.   nystatin 100000 UNIT/ML suspension Commonly known as: MYCOSTATIN Take 10 mLs by mouth 2 (two) times daily. Swish and swallow   omeprazole 20 MG capsule Commonly known as: PRILOSEC Take 20 mg by mouth 2 (two) times daily before a meal.   oxyCODONE 5 MG immediate release tablet Commonly known as: Oxy IR/ROXICODONE Take 5 mg by mouth every 6 (six) hours as needed for severe pain.   QUEtiapine 100 MG tablet Commonly known as: SEROQUEL Take 100 mg by mouth at bedtime. Take 100mg  at bedtime for 14 days. Then take 50mg  at bedtime for 7 days. Then stop.   sertraline 100 MG tablet Commonly known as: ZOLOFT Take 200 mg by mouth daily.   Vitamin D3 25 MCG (1000 UT) Caps Take 2,000 Units by mouth daily.  Follow-up Information     Center, [facility]. Call in 1 week(s).   Specialty: General Practice Contact information: [address] [place] [address] [phone]                Discharge Exam: Ceasar Mons Weights   05/22/23 1255  Weight: 74.8 kg   Physical Exam Vitals and nursing note reviewed.  Constitutional:      General: He is not in acute distress.    Comments: At baseline  HENT:     Head: Normocephalic and atraumatic.  Skin:    General: Skin is warm and  dry.  Neurological:     Mental Status: He is alert. Mental status is at baseline.    Condition at discharge: good  The results of significant diagnostics from this hospitalization (including imaging, microbiology, ancillary and laboratory) are listed below for reference.   Imaging Studies: MR BRAIN WO CONTRAST  Result Date: 05/22/2023 CLINICAL DATA:  Neuro deficit, acute, stroke suspected EXAM: MRI HEAD WITHOUT CONTRAST TECHNIQUE: Multiplanar, multiecho pulse sequences of the brain and surrounding structures were obtained without intravenous contrast. COMPARISON:  Same day head CT FINDINGS: Brain: Negative for an acute infarct. No mass effect. No mass lesion. No extra-axial fluid collection. No hemorrhage. No hydrocephalus. There is a background of mild chronic microvascular ischemic change. Vascular: Normal flow voids. Skull and upper cervical spine: Normal marrow signal. Sinuses/Orbits: There is a large right mastoid and middle ear effusion. Fluid is also present in the right eustachian tube. There is asymmetric edema in the soft tissues overlying the right-sided mastoid air cells. Orbits are unremarkable. Other: None. IMPRESSION: 1. No acute intracranial process. 2. Large right mastoid and middle ear effusion. Fluid is also present in the right Eustachian tube. These changes are likely postsurgical in nature. If there is clinical concern for CNS infection, further evaluation with contrast-enhanced brain MRI is recommended. Electronically Signed   By: Lorenza Cambridge M.D.   On: 05/22/2023 18:23   CT HEAD WO CONTRAST ( )  Result Date: 05/22/2023 CLINICAL DATA:  Mental status change.  Unknown cause. EXAM: CT HEAD WITHOUT CONTRAST TECHNIQUE: Contiguous axial images were obtained from the base of the skull through the vertex without intravenous contrast. RADIATION DOSE REDUCTION: This exam was performed according to the departmental dose-optimization program which includes automated exposure control,  adjustment of the mA and/or kV according to patient size and/or use of iterative reconstruction technique. COMPARISON:  CT scan head from 04/20/2023. FINDINGS: Brain: No evidence of acute infarction, hemorrhage, hydrocephalus, extra-axial collection or mass lesion/mass effect. Otherwise normal appearance of brain parenchyma. Ventricles are normal. Cerebral volume is age appropriate. Vascular: No hyperdense vessel or unexpected calcification. Intracranial arteriosclerosis. Skull: Normal. Negative for fracture or focal lesion. Sinuses/Orbits: No acute finding. Mild mucoperiosteal thickening noted in bilateral maxillary sinus. No air-fluid levels. Other: There is moderate right mastoid effusion, new since the prior study. Overlying bone cortex is intact. Correlate clinically to exclude acute mastoiditis. Left-sided mastoid air cells are unremarkable. No mastoid effusion. IMPRESSION: *No acute intracranial abnormality. Electronically Signed   By: Jules Schick M.D.   On: 05/22/2023 14:02   DG Chest Port 1 View  Result Date: 05/22/2023 CLINICAL DATA:  Questionable sepsis - evaluate for abnormality. Altered mental status. EXAM: PORTABLE CHEST 1 VIEW COMPARISON:  04/24/2023. FINDINGS: Bilateral lung fields are clear. Bilateral costophrenic angles are clear. Normal cardio-mediastinal silhouette. No acute osseous abnormalities. The soft tissues are within normal limits. IMPRESSION: *No active disease. Electronically Signed   By:  Jules Schick M.D.   On: 05/22/2023 13:57   DG Chest Port 1 View  Result Date: 04/25/2023 CLINICAL DATA:  Sepsis EXAM: PORTABLE CHEST 1 VIEW COMPARISON:  None Available. FINDINGS: The heart size and mediastinal contours are within normal limits. Both lungs are clear. The visualized skeletal structures are unremarkable. IMPRESSION: No active disease. Electronically Signed   By: Helyn Numbers M.D.   On: 04/25/2023 00:13    Microbiology: Results for orders placed or performed during the  hospital encounter of 04/24/23  Culture, blood (Routine x 2)     Status: None   Collection Time: 04/24/23 11:41 PM   Specimen: BLOOD  Result Value Ref Range Status   Specimen Description BLOOD BLOOD RIGHT ARM  Final   Special Requests   Final    BOTTLES DRAWN AEROBIC AND ANAEROBIC Blood Culture adequate volume   Culture   Final    NO GROWTH 5 DAYS Performed at [facility], [address], [place], [place] [address]    Report Status 04/29/2023 FINAL  Final  Culture, blood (Routine x 2)     Status: None   Collection Time: 04/24/23 11:41 PM   Specimen: BLOOD  Result Value Ref Range Status   Specimen Description BLOOD RIGHT ANTECUBITAL  Final   Special Requests   Final    BOTTLES DRAWN AEROBIC AND ANAEROBIC Blood Culture adequate volume   Culture   Final    NO GROWTH 5 DAYS Performed at [facility], [address], [place], [place] [address]    Report Status 04/29/2023 FINAL  Final  Resp panel by RT-PCR (RSV, Flu A&B, Covid) Urine, Catheterized     Status: None   Collection Time: 04/25/23 12:52 AM   Specimen: Urine, Catheterized; Nasal Swab  Result Value Ref Range Status   SARS Coronavirus 2 by RT PCR NEGATIVE NEGATIVE Final    Comment: (NOTE) SARS-CoV-2 target nucleic acids are NOT DETECTED.  The SARS-CoV-2 RNA is generally detectable in upper respiratory specimens during the acute phase of infection. The lowest concentration of SARS-CoV-2 viral copies this assay can detect is 138 copies/mL. A negative result does not preclude SARS-Cov-2 infection and should not be used as the sole basis for treatment or other patient management decisions. A negative result may occur with  improper specimen collection/handling, submission of specimen other than nasopharyngeal swab, presence of viral mutation(s) within the areas targeted by this assay, and inadequate number of viral copies(<138 copies/mL). A negative result must be combined with clinical observations,  patient history, and epidemiological information. The expected result is Negative.  Fact Sheet for Patients:  BloggerCourse.com  Fact Sheet for Healthcare Providers:  SeriousBroker.it  This test is no t yet approved or cleared by the [place] FDA and  has been authorized for detection and/or diagnosis of SARS-CoV-2 by FDA under an Emergency Use Authorization (EUA). This EUA will remain  in effect (meaning this test can be used) for the duration of the COVID-19 declaration under Section 564(b)(1) of the Act, 21 U.S.C.section 360bbb-3(b)(1), unless the authorization is terminated  or revoked sooner.       Influenza A by PCR NEGATIVE NEGATIVE Final   Influenza B by PCR NEGATIVE NEGATIVE Final    Comment: (NOTE) The Xpert Xpress SARS-CoV-2/FLU/RSV plus assay is intended as an aid in the diagnosis of influenza from Nasopharyngeal swab specimens and should not be used as a sole basis for treatment. Nasal washings and aspirates are unacceptable for Xpert Xpress SARS-CoV-2/FLU/RSV testing.  Fact  Sheet for Patients: BloggerCourse.com  Fact Sheet for Healthcare Providers: SeriousBroker.it  This test is not yet approved or cleared by the [place] FDA and has been authorized for detection and/or diagnosis of SARS-CoV-2 by FDA under an Emergency Use Authorization (EUA). This EUA will remain in effect (meaning this test can be used) for the duration of the COVID-19 declaration under Section 564(b)(1) of the Act, 21 U.S.C. section 360bbb-3(b)(1), unless the authorization is terminated or revoked.     Resp Syncytial Virus by PCR NEGATIVE NEGATIVE Final    Comment: (NOTE) Fact Sheet for Patients: BloggerCourse.com  Fact Sheet for Healthcare Providers: SeriousBroker.it  This test is not yet approved or cleared by the [place] FDA and has been authorized for detection and/or diagnosis of SARS-CoV-2 by FDA under an Emergency Use Authorization (EUA). This EUA will remain in effect (meaning this test can be used) for the duration of the COVID-19 declaration under Section 564(b)(1) of the Act, 21 U.S.C. section 360bbb-3(b)(1), unless the authorization is terminated or revoked.  Performed at [facility], [address] [place], [place] [address]     Labs: CBC: Recent Labs  Lab 05/22/23 1257  WBC 6.6  NEUTROABS 5.4  HGB 13.7  HCT 41.2  MCV 96.5  PLT 105*   Basic Metabolic Panel: Recent Labs  Lab 05/22/23 1257  NA 140  K 4.2  CL 104  CO2 28  GLUCOSE 91  BUN 17  CREATININE 1.13  CALCIUM 9.0   Liver Function Tests: Recent Labs  Lab 05/22/23 1257  AST 18  ALT 14  ALKPHOS 79  BILITOT 0.9  PROT 7.0  ALBUMIN 3.7   CBG: No results for input(s): "GLUCAP" in the last 168 hours.  Discharge time spent: less than 30 minutes.  Signed: Verdene Lennert, MD [facility] 05/22/2023

## 2023-05-22 NOTE — ED Notes (Signed)
Pt in MRI.

## 2023-06-09 NOTE — Progress Notes (Signed)
 HPI: Ronnie Strickland is a 72 y.o. male with a history of right EAC osteoradionecrosis seen for his first postop visit following right canalplasty to debride nonviable bone and biodesign tympanoplasty. His pain is much improved since surgery and he is not having drainage.  05/18/23 - (R) canalplasty with debridement of nonviable bone and tympanoplasty Thomasina Francella Nest, CMA  06/09/2023  3:30 PM  Sign when Signing Visit Review of Systems  HENT:  Positive for trouble swallowing.   Musculoskeletal:  Positive for neck pain.       Walking diifculty  Neurological:  Positive for dizziness and numbness.  Hematological:  Bruises/bleeds easily.       Prolonged bleeding   Using our specific review of systems template, a review of systems sheet has been completed by the patient .  Positive complaints have been documented appropriately. During this visit, vital signs were obtained; patient specific allergies, falls risk assessment and current medication list have been reviewed. Any barriers to learning were recognized to ensure optimal education and comprehension.       PHYSICAL EXAM:  Vitals:   06/09/23 1519  BP: (!) 86/56  Pulse: 66  Temp: 36.1 C (97 F)   GENERAL:  Pleasant appearing and well groomed.   EYES:  Extra-ocular muscles are intact. EAR:  Ointment removed from the right ear canal. The skin graft is covering the anterior and inferior portion of the ear canal. The tympanic membrane is intact.  NECK:  No lymphadenopathy, salivary gland enlargement or thyroid  gland enlargement. NEURO:  Cranial nerves 2-12 are intact.  There is no spontaneous nystagmus.  Gait is normal. MUSCULOSKELETAL:  TMJ is normal and face is symmetrical. RESPIRATORY:  The patient is breathing quietly without distress. SKIN:  No lesions are seen on skin of the ears. Assessment: Ronnie Strickland is a 72 y.o. male with right EAC osteonecrosis doing well s/p canalplasty with debridement of non-viable bone and tympanoplasty.  He will continue water  precautions and continue Ciprodex, 3 drops twice a day x 10 days. I will see him back in 6 weeks with an audiogram.    ICD-10-CM   1. Osteoradionecrosis of mandible  M27.2 Ambulatory Referral to Audiology   Y84.2      Plan:Return in about 6 weeks (around 07/21/2023) for AUDIO, RET.  I personally performed or jointly provided the services with an auxiliary provider (RN, pensions consultant, etc.) but there was no involvement of a resident or fellow.   DAVID M KAYLIE

## 2023-06-26 ENCOUNTER — Other Ambulatory Visit: Payer: Self-pay

## 2023-06-26 ENCOUNTER — Emergency Department: Payer: Non-veteran care

## 2023-06-26 ENCOUNTER — Emergency Department
Admission: EM | Admit: 2023-06-26 | Discharge: 2023-06-26 | Disposition: A | Discharge status: Home / Self Care | Payer: Non-veteran care | Attending: Emergency Medicine | Admitting: Emergency Medicine

## 2023-06-26 DIAGNOSIS — R519 Headache, unspecified: Secondary | ICD-10-CM | POA: Diagnosis present

## 2023-06-26 DIAGNOSIS — R000 Tachycardia, unspecified: Secondary | ICD-10-CM | POA: Diagnosis not present

## 2023-06-26 DIAGNOSIS — F0390 Unspecified dementia without behavioral disturbance: Secondary | ICD-10-CM | POA: Insufficient documentation

## 2023-06-26 LAB — CBC
HCT: 41.5 % (ref 39.0–52.0)
Hemoglobin: 13.9 g/dL (ref 13.0–17.0)
MCH: 31.7 pg (ref 26.0–34.0)
MCHC: 33.5 g/dL (ref 30.0–36.0)
MCV: 94.7 fL (ref 80.0–100.0)
Platelets: 107 10*3/uL — ABNORMAL LOW (ref 150–400)
RBC: 4.38 MIL/uL (ref 4.22–5.81)
RDW: 12.8 % (ref 11.5–15.5)
WBC: 6.8 10*3/uL (ref 4.0–10.5)
nRBC: 0 % (ref 0.0–0.2)

## 2023-06-26 LAB — COMPREHENSIVE METABOLIC PANEL
ALT: 14 U/L (ref 0–44)
AST: 20 U/L (ref 15–41)
Albumin: 3.6 g/dL (ref 3.5–5.0)
Alkaline Phosphatase: 83 U/L (ref 38–126)
Anion gap: 8 (ref 5–15)
BUN: 30 mg/dL — ABNORMAL HIGH (ref 8–23)
CO2: 28 mmol/L (ref 22–32)
Calcium: 9 mg/dL (ref 8.9–10.3)
Chloride: 103 mmol/L (ref 98–111)
Creatinine, Ser: 1.36 mg/dL — ABNORMAL HIGH (ref 0.61–1.24)
GFR, Estimated: 55 mL/min — ABNORMAL LOW (ref >60)
Glucose, Bld: 86 mg/dL (ref 70–99)
Potassium: 4.3 mmol/L (ref 3.5–5.1)
Sodium: 139 mmol/L (ref 135–145)
Total Bilirubin: 0.8 mg/dL (ref <1.2)
Total Protein: 6.9 g/dL (ref 6.5–8.1)

## 2023-06-26 NOTE — Discharge Instructions (Signed)
You were seen in the ER today for your headache and shaking.  We fortunately did not find an emergency cause for this.  Follow-up with your primary care doctor for further evaluation.  Return to the ER for new or worsening symptoms.

## 2023-06-26 NOTE — ED Provider Triage Note (Signed)
Emergency Medicine Provider Triage Evaluation Note  Ronnie Strickland , a 72 y.o. male  was evaluated in triage.  Pt was brought by EMS from home, patient states having posterior right headache.  Patient denies fever, cough.   Review of Systems  Positive: Intentional tremor Physical Exam  BP 124/86 (BP Location: Right Arm)   Pulse (!) 102   Temp 98.7 F (37.1 C) (Oral)   Resp 20   Wt 79.8 kg   SpO2 98%   BMI 23.22 kg/m  Gen:   Awake, no distress ,  Throat:no erythema Resp:  Normal effort, no wheezing no crackles MSK:   Moves extremities without difficulty.  Tremor in upper and lower extremities Other:    Medical Decision Making  Medically screening exam initiated at 8:04 PM.  Appropriate orders placed.  [name] was informed that the remainder of the evaluation will be completed by another provider, this initial triage assessment does not replace that evaluation, and the importance of remaining in the ED until their evaluation is complete.     Gladys Damme, PA-C 06/26/23 2006

## 2023-06-26 NOTE — ED Provider Notes (Signed)
[facility] Provider Note    Event Date/Time   First MD Initiated Contact with Patient 06/26/23 2141     (approximate)   History   Headache   HPI  Ronnie Strickland is a 72 year old male with history of dementia, PTSD presenting to the emerged department for evaluation of headache.  Family reports that for the past few weeks patient has had some increased tremors throughout his body.  Shortly prior to presentation, patient had onset of a headache over the right posterior aspect of his head.  No trauma.  Not on anticoagulation.  Wife reports history of dementia, does not feel that the patient is more confused than his baseline.     Physical Exam   Triage Vital Signs: ED Triage Vitals  Encounter Vitals Group     BP 06/26/23 1952 124/86     Systolic BP Percentile --      Diastolic BP Percentile --      Pulse Rate 06/26/23 1952 (!) 102     Resp 06/26/23 1952 20     Temp 06/26/23 1952 98.7 F (37.1 C)     Temp Source 06/26/23 1952 Oral     SpO2 06/26/23 1952 98 %     Weight 06/26/23 1953 176 lb (79.8 kg)     Height --      Head Circumference --      Peak Flow --      Pain Score --      Pain Loc --      Pain Education --      Exclude from Growth Chart --     Most recent vital signs: Vitals:   06/26/23 1952 06/26/23 2335  BP: 124/86 126/83  Pulse: (!) 102   Resp: 20 14  Temp: 98.7 F (37.1 C) 98.9 F (37.2 C)  SpO2: 98% 95%     General: Awake, interactive  CV:  Mild tachycardia, good peripheral perfusion Resp:  Unlabored respirations, lungs clear to auscultation Abd:  Nondistended.  Neuro:  Alert and oriented, normal extraocular movements, symmetric facial movement, sensation intact over bilateral upper and lower extremities with 5 out of 5 strength.  Normal finger-to-nose testing.  Slow to respond, but appropriately following commands.   ED Results / Procedures / Treatments   Labs (all labs ordered are listed, but only abnormal results  are displayed) Labs Reviewed  CBC - Abnormal; Notable for the following components:      Result Value   Platelets 107 (*)    All other components within normal limits  COMPREHENSIVE METABOLIC PANEL - Abnormal; Notable for the following components:   BUN 30 (*)    Creatinine, Ser 1.36 (*)    GFR, Estimated 55 (*)    All other components within normal limits     EKG EKG independently reviewed interpreted by myself (ER attending) demonstrates:    RADIOLOGY Imaging independently reviewed and interpreted by myself demonstrates:  CT head without acute bleed or other acute findings  PROCEDURES:  Critical Care performed: No  Procedures   MEDICATIONS ORDERED IN ED: Medications - No data to display   IMPRESSION / MDM / ASSESSMENT AND PLAN / ED COURSE  I reviewed the triage vital signs and the nursing notes.  Differential diagnosis includes, but is not limited to, subarachnoid hemorrhage, other intracranial bleed, unidentified trauma, benign headache, anemia, electrolyte abnormality  Patient's presentation is most consistent with acute presentation with potential threat to life or bodily function.  72 year old male presenting to  the Emergency Department for evaluation of headache shortly prior to presentation.  Mild tachycardia on presentation, vitals otherwise stable.  Lab sent from triage without critical derangements.  CT head reassuring.  Did discuss further evaluation of patient's possible tremors and weakness including urine, chest x-Apple Dearmas.  Patient does feel improved currently and both wife and patient are comfortable with discharge and outpatient follow-up.  Do think this is reasonable.  Strict return precautions provided.  Patient discharged stable condition.      FINAL CLINICAL IMPRESSION(S) / ED DIAGNOSES   Final diagnoses:  Acute nonintractable headache, unspecified headache type     Rx / DC Orders   ED Discharge Orders     None        Note:  This document  was prepared using Dragon voice recognition software and may include unintentional dictation errors.   Trinna Post, MD 06/26/23 [phone]

## 2023-06-26 NOTE — ED Triage Notes (Signed)
Pt presents to ER via ems from home with c/o headache to right posterior head that started appx 1hr ago.  Pt denies recent falls or trauma to head, denies taking any blood thinners.  Pt also c/o some full body tremors that started around 3 weeks ago.  Denies hx of parkinson's, but does have hx of dementia and PTSD.  Pt is otherwise A&O x3 (disoriented to time in triage) and in NAD in triage.

## 2023-06-26 NOTE — ED Notes (Signed)
Pts spouse is now at the bedside.

## 2023-06-26 NOTE — ED Notes (Signed)
Patient has attempted multiple times to get out of bed and has sucessfully gotten out once before staff could respond to bed alarm. Charge Mel made aware of the need for a Recruitment consultant.

## 2023-07-01 ENCOUNTER — Emergency Department
Admission: EM | Admit: 2023-07-01 | Discharge: 2023-07-01 | Disposition: A | Discharge status: Home / Self Care | Payer: No Typology Code available for payment source | Attending: Emergency Medicine | Admitting: Emergency Medicine

## 2023-07-01 ENCOUNTER — Emergency Department: Payer: No Typology Code available for payment source

## 2023-07-01 DIAGNOSIS — R569 Unspecified convulsions: Secondary | ICD-10-CM | POA: Diagnosis not present

## 2023-07-01 DIAGNOSIS — R4182 Altered mental status, unspecified: Secondary | ICD-10-CM | POA: Diagnosis present

## 2023-07-01 DIAGNOSIS — Y92002 Bathroom of unspecified non-institutional (private) residence single-family (private) house as the place of occurrence of the external cause: Secondary | ICD-10-CM | POA: Insufficient documentation

## 2023-07-01 DIAGNOSIS — Z859 Personal history of malignant neoplasm, unspecified: Secondary | ICD-10-CM | POA: Insufficient documentation

## 2023-07-01 DIAGNOSIS — W182XXA Fall in (into) shower or empty bathtub, initial encounter: Secondary | ICD-10-CM | POA: Diagnosis not present

## 2023-07-01 DIAGNOSIS — Y93E1 Activity, personal bathing and showering: Secondary | ICD-10-CM | POA: Diagnosis not present

## 2023-07-01 MED ORDER — LEVETIRACETAM IN NACL 1500 MG/100ML IV SOLN
1500.0000 mg | Freq: Once | INTRAVENOUS | Status: AC
  Administered 2023-07-01: 1500 mg via INTRAVENOUS
  Filled 2023-07-01: qty 100

## 2023-07-01 MED ORDER — SODIUM CHLORIDE 0.9% FLUSH: 3.0000 mL | Freq: Once | INTRAVENOUS | Status: DC

## 2023-07-01 MED ORDER — LEVETIRACETAM 500 MG PO TABS: 500.0000 mg | ORAL_TABLET | Freq: Two times a day (BID) | ORAL | 2 refills | Status: DC

## 2023-07-01 MED ORDER — SODIUM CHLORIDE 0.9 % IV SOLN: 20.0000 mg/kg | Freq: Once | INTRAVENOUS | Status: DC

## 2023-07-01 NOTE — Discharge Instructions (Signed)
 You are seen in the emergency department after an episode that was concerning for a seizure.    Seizure precautions: Do not do anything that may harm yourself or others if you were to have a seizure while doing it.  This means no driving, no operating any heavy machinery, no heights, no swimming alone. Seizure precautions for 6 months or until you are cleared by a neurologist.  Call to schedule close follow up/out-patient follow up.  Call to follow-up with neurology.  You are given information for local neurologist you can also discussed with your primary care physician.  You were started on a medication called Keppra  to take twice a day.  Return to the emergency department for any worsening symptoms.  Thank you for choosing us  for your health care, it was my pleasure to care for you today!  Clotilda Punter, MD

## 2023-07-01 NOTE — ED Triage Notes (Signed)
 Pt to ED via [facility] from home. Pt sustained unwitnessed fall and hit head on shower. Pt unresponsive on EMS arrival. Unknown LKW. Pt EMS reports HTN and cancer. Pt alert but nonverbal on arrival. RA 100% on arrival.

## 2023-07-01 NOTE — ED Notes (Signed)
 CT called to take pt to CT 2

## 2023-07-01 NOTE — ED Notes (Signed)
 Pt back from CT 2 with this RN

## 2023-07-01 NOTE — ED Notes (Signed)
 Family at bedside and updated. EDP made aware family present

## 2023-07-01 NOTE — ED Notes (Signed)
 RN at bedside due to bed alarm sounding. Pt at end of bed pointing to linen bin stating, I need my shoes. This RN assuring pt he came by EMS and did not come with shoes. RN further stating wife is on her back and should be bringing clothes. Pt assisted back in bed and given hospital socks.

## 2023-07-01 NOTE — ED Notes (Signed)
 Pt transported to xray

## 2023-07-01 NOTE — ED Provider Notes (Signed)
 [facility] Provider Note    Event Date/Time   First MD Initiated Contact with Patient 07/01/23 [phone]     (approximate)   History   Fall and Altered Mental Status   HPI  Ronnie Strickland is a 73 y.o. male unknown past medical history who presents to the emergency department with altered mental status.  EMS reports that the wife called out for a fall.  Unknown of his last known well time or what his baseline is.  States that they were called out for a fall and that the patient has been disoriented and not responding to questions.  States that his glucose was within normal limits.  Stated that he has been hypoxic at room air and then 77% on 4 L however nonlabored breathing and was 100% on arrival to the emergency department on room air.  States that he has a history of cancer and that his wife is a poor historian and they are uncertain if she is coming up to the emergency department.  Unknown if the patient is on anticoagulation.  Patient not participating in exam.  Patient's family arrived.  Patient's wife stated that at approximately 630 this morning she heard a big thud with a fall into the shower/bathroom with significant damage to the shower door.  Since that time she is not been able to get him to respond.  Very slow to respond.  States that he is not acting his normal self.  Another episode last week where he had a shaking episode and was feeling very cold and then had a very slow to respond and altered mental status presentation.  Patient was admitted and had an MRI done and ultimately was discharged home.  No prior diagnosis of seizure in the past.     Physical Exam   Triage Vital Signs: ED Triage Vitals  Encounter Vitals Group     BP      Systolic BP Percentile      Diastolic BP Percentile      Pulse      Resp      Temp      Temp src      SpO2      Weight      Height      Head Circumference      Peak Flow      Pain Score      Pain Loc      Pain  Education      Exclude from Growth Chart     Most recent vital signs: Vitals:   07/01/23 1030 07/01/23 1124  BP: 134/86   Pulse: 62   Resp: 13   Temp:    SpO2: 98% 100%    Physical Exam Constitutional:      Appearance: He is well-developed. He is ill-appearing.  HENT:     Head: Atraumatic.  Eyes:     Extraocular Movements: Extraocular movements intact.     Conjunctiva/sclera: Conjunctivae normal.     Pupils: Pupils are equal, round, and reactive to light.  Cardiovascular:     Rate and Rhythm: Regular rhythm.  Pulmonary:     Effort: No respiratory distress.  Abdominal:     Tenderness: There is no abdominal tenderness.  Musculoskeletal:     Cervical back: Normal range of motion. No tenderness.     Right lower leg: No edema.     Left lower leg: No edema.  Skin:    General: Skin is warm.  Neurological:  Mental Status: He is alert. Mental status is at baseline.     GCS: GCS eye subscore is 4. GCS verbal subscore is 3. GCS motor subscore is 4.     Comments: Drops all extremity to gravity, 0/5     IMPRESSION / MDM / ASSESSMENT AND PLAN / ED COURSE  I reviewed the triage vital signs and the nursing notes.  Broad differential diagnosis, patient arrives to the emergency department with altered mental status with unknown baseline, has a nonfocal neurologic exam and unknown last well time.  Attempting to get a hold of the patient's wife, no contact information in the chart.  Differential diagnosis including intracranial hemorrhage, CVA, electrolyte abnormality, dehydration, infectious process, dysrhythmia  Patient taken back stat for CT scan of the head and cervical spine  EKG  I, Clotilda Punter, the attending physician, personally viewed and interpreted this ECG.   Rate: Normal  Rhythm: Normal sinus  Axis: Normal  Intervals: Normal  ST&T Change: None  No tachycardic or bradycardic dysrhythmias while on cardiac telemetry.  RADIOLOGY I independently reviewed  imaging, my interpretation of imaging: CT scan of the head no findings of a mass or intracranial hemorrhage  CT C-spine -no signs of fracture  LABS (all labs ordered are listed, but only abnormal results are displayed) Labs interpreted as -    Labs Reviewed  CBC - Abnormal; Notable for the following components:      Result Value   RBC 3.98 (*)    Hemoglobin 12.7 (*)    HCT 38.4 (*)    Platelets 107 (*)    All other components within normal limits  DIFFERENTIAL - Abnormal; Notable for the following components:   Lymphs Abs 0.6 (*)    All other components within normal limits  COMPREHENSIVE METABOLIC PANEL - Abnormal; Notable for the following components:   Calcium  8.8 (*)    Albumin 3.4 (*)    All other components within normal limits  URINALYSIS, W/ REFLEX TO CULTURE (INFECTION SUSPECTED) - Abnormal; Notable for the following components:   Color, Urine YELLOW (*)    APPearance CLEAR (*)    All other components within normal limits  RESP PANEL BY RT-PCR (RSV, FLU A&B, COVID)  RVPGX2  LACTIC ACID, PLASMA  PROTIME-INR  APTT  ETHANOL  TSH  T4, FREE  TROPONIN I (HIGH SENSITIVITY)  TROPONIN I (HIGH SENSITIVITY)     MDM  Patient presented to the emergency department with unknown last known well time with what appears to be altered mental status and a fall.  Patient's wife arrived and chart was merged.  Patient has a history of dementia and frequent falls.  Had a recent hospitalization for an episode of transient altered mental status and confusion with very slow to respond.  MRI was negative at that time and workup was overall negative.  Repeat neurologic exam patient able to state his name and his wife's name.  Very slow but able to hold up all extremities to gravity.  Cranial nerves intact.  Lab work overall reassuring.  No significant electrolyte abnormality.  Normal lactic acid.  COVID and influenza testing obtained.  CT scan is negative.  No signs of ACS, have a low  suspicion for dysrhythmia.  Possible seizure given that the patient had very slow to respond following a fall with altered mental status.  Consulting neurology for further evaluation of possible seizure workup.  Discussed the patient's case with neurology Dr. Matthews who recommended starting the patient on Keppra  given significant  concern for new onset seizures.  If returned back to baseline recommended discharge home with outpatient follow-up.  Recommended 20 mg/kg bolus of IV Keppra  followed by 500 twice daily.  Patient is back to baseline and feeling much better.  Interacting with family members who feel safe with taking him back home.  [facility] and also given local neurology information.  PROCEDURES:  Critical Care performed: No  Procedures  Patient's presentation is most consistent with acute presentation with potential threat to life or bodily function.   MEDICATIONS ORDERED IN ED: Medications  sodium chloride  flush (NS) 0.9 % injection 3 mL ( Intravenous Canceled Entry 07/01/23 0744)  levETIRAcetam  (KEPPRA ) IVPB 1500 mg/ 100 mL premix (0 mg Intravenous Stopped 07/01/23 1225)    FINAL CLINICAL IMPRESSION(S) / ED DIAGNOSES   Final diagnoses:  Altered mental status, unspecified altered mental status type  Seizure (HCC)     Rx / DC Orders   ED Discharge Orders     None        Note:  This document was prepared using Dragon voice recognition software and may include unintentional dictation errors.   Suzanne Kirsch, MD 07/01/23 1259

## 2023-07-01 NOTE — ED Notes (Addendum)
 Pt now able to say a few words. Wife reports waking up from hearing pt fall approximately 0630. Family reports hx of dementia but normally able to hold conversation.

## 2024-05-30 NOTE — Progress Notes (Signed)
 Subjective:Ronnie Strickland is a 73 y.o. male here in followup of right radionecrosis s/p canalplasty here to followup exposed bone in the right ear canal. He has been having intense pain in the right ear for the past month. History of Present Illness Ronnie Strickland is a 73 year old male who presents with severe ear pain and exposed bone in the ear canal.  He has severe ear pain, rated as an 'eight or nine' on a scale of ten, which has been persistent and worsening since Sunday. He describes a sensation of heaviness in the ear. He has no hearing in the affected ear, and the pain is similar to what he experienced before his previous surgery.  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 Bluford and has healed. He has been using powder every two weeks, but no other substances are being applied to the ear.  He was previously seen at the [facility] where two CT scans were performed, but they were not satisfactory. An MRI was suggested but not completed. He was given antibiotic drops, which he is currently using, but reports that the pain remains unchanged.  05/18/23 - (R) canalplasty to debride nonviable bone, posterior TM perforation.   Dallie Mems, CMA  05/30/2024  3:44 PM  Sign when Signing Visit Review of Systems  Constitutional: Negative.   HENT:  Positive for congestion, ear pain and trouble swallowing.   Eyes: Negative.   Respiratory: Negative.    Cardiovascular: Negative.   Gastrointestinal: Negative.   Endocrine: Negative.   Genitourinary: Negative.   Musculoskeletal: Negative.   Skin: Negative.   Allergic/Immunologic: Negative.   Neurological:  Positive for dizziness, speech difficulty and weakness.  Hematological:  Bruises/bleeds easily.  Psychiatric/Behavioral: Negative.         Physical exam:   Vitals:   05/30/24 1542  BP: 102/68  Pulse: 65  Temp: 36.1 C (97 F)   General: Patient is well groomed and pleasant appearing Ears: External auditory canal  on the right has dried powder, cerumen, and squamous debris. There is exposed bone in the anterior-inferior ear canal that is not infected.  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 Previous CT scans were inadequate. Surgical intervention may be necessary to remove infected necrotic bone, potentially impacting hearing, though he has minimal hearing in the affected ear. Ordered CT scan and MRI to assess bone and surrounding tissue, and a hearing test. A follow-up appointment is scheduled to discuss imaging results and treatment plan.  Chronic left ear pain   Severe and persistent pain rated 8-9/10 is associated with osteoradionecrosis. Pain management and potential surgical intervention were discussed. Continue current pain management regimen and will discuss potential surgical intervention to alleviate pain and remove infected bone.  Plan: Return for CT, MRI, audiogram and return visit. This note has been created using automated tools and reviewed for accuracy by ARIEL NICOLE HAYDEN. I personally performed or jointly provided the services with an auxiliary provider (RN, pensions consultant, etc.) but there was no involvement of a resident or fellow.   DAVID M KAYLIE \

## 2024-06-02 ENCOUNTER — Encounter (HOSPITAL_COMMUNITY): Payer: Self-pay

## 2024-06-02 ENCOUNTER — Emergency Department (HOSPITAL_COMMUNITY)

## 2024-06-02 ENCOUNTER — Inpatient Hospital Stay (HOSPITAL_COMMUNITY)
Admission: EM | Admit: 2024-06-02 | Discharge: 2024-06-04 | DRG: 100 | Disposition: A | Discharge status: Home Health | Attending: Internal Medicine | Admitting: Internal Medicine

## 2024-06-02 ENCOUNTER — Other Ambulatory Visit: Payer: Self-pay

## 2024-06-02 DIAGNOSIS — D649 Anemia, unspecified: Secondary | ICD-10-CM | POA: Diagnosis not present

## 2024-06-02 DIAGNOSIS — E785 Hyperlipidemia, unspecified: Secondary | ICD-10-CM | POA: Diagnosis present

## 2024-06-02 DIAGNOSIS — H9319 Tinnitus, unspecified ear: Secondary | ICD-10-CM | POA: Diagnosis present

## 2024-06-02 DIAGNOSIS — Z85818 Personal history of malignant neoplasm of other sites of lip, oral cavity, and pharynx: Secondary | ICD-10-CM

## 2024-06-02 DIAGNOSIS — G9340 Encephalopathy, unspecified: Secondary | ICD-10-CM | POA: Diagnosis not present

## 2024-06-02 DIAGNOSIS — Z91018 Allergy to other foods: Secondary | ICD-10-CM

## 2024-06-02 DIAGNOSIS — I951 Orthostatic hypotension: Secondary | ICD-10-CM | POA: Diagnosis present

## 2024-06-02 DIAGNOSIS — Y92009 Unspecified place in unspecified non-institutional (private) residence as the place of occurrence of the external cause: Secondary | ICD-10-CM

## 2024-06-02 DIAGNOSIS — Z91148 Patient's other noncompliance with medication regimen for other reason: Secondary | ICD-10-CM

## 2024-06-02 DIAGNOSIS — F0390 Unspecified dementia without behavioral disturbance: Secondary | ICD-10-CM | POA: Diagnosis not present

## 2024-06-02 DIAGNOSIS — E1122 Type 2 diabetes mellitus with diabetic chronic kidney disease: Secondary | ICD-10-CM | POA: Diagnosis present

## 2024-06-02 DIAGNOSIS — G4733 Obstructive sleep apnea (adult) (pediatric): Secondary | ICD-10-CM | POA: Diagnosis present

## 2024-06-02 DIAGNOSIS — Z8261 Family history of arthritis: Secondary | ICD-10-CM

## 2024-06-02 DIAGNOSIS — Z87891 Personal history of nicotine dependence: Secondary | ICD-10-CM

## 2024-06-02 DIAGNOSIS — F0280 Dementia in other diseases classified elsewhere without behavioral disturbance: Secondary | ICD-10-CM | POA: Diagnosis present

## 2024-06-02 DIAGNOSIS — I129 Hypertensive chronic kidney disease with stage 1 through stage 4 chronic kidney disease, or unspecified chronic kidney disease: Secondary | ICD-10-CM | POA: Diagnosis present

## 2024-06-02 DIAGNOSIS — D696 Thrombocytopenia, unspecified: Secondary | ICD-10-CM | POA: Diagnosis present

## 2024-06-02 DIAGNOSIS — N189 Chronic kidney disease, unspecified: Secondary | ICD-10-CM

## 2024-06-02 DIAGNOSIS — R4182 Altered mental status, unspecified: Principal | ICD-10-CM

## 2024-06-02 DIAGNOSIS — N179 Acute kidney failure, unspecified: Secondary | ICD-10-CM | POA: Diagnosis present

## 2024-06-02 DIAGNOSIS — R55 Syncope and collapse: Secondary | ICD-10-CM

## 2024-06-02 DIAGNOSIS — Z85828 Personal history of other malignant neoplasm of skin: Secondary | ICD-10-CM

## 2024-06-02 DIAGNOSIS — Z66 Do not resuscitate: Secondary | ICD-10-CM | POA: Diagnosis present

## 2024-06-02 DIAGNOSIS — Z923 Personal history of irradiation: Secondary | ICD-10-CM

## 2024-06-02 DIAGNOSIS — R2990 Unspecified symptoms and signs involving the nervous system: Secondary | ICD-10-CM | POA: Diagnosis present

## 2024-06-02 DIAGNOSIS — R4701 Aphasia: Secondary | ICD-10-CM | POA: Diagnosis present

## 2024-06-02 DIAGNOSIS — W1830XA Fall on same level, unspecified, initial encounter: Secondary | ICD-10-CM | POA: Diagnosis present

## 2024-06-02 DIAGNOSIS — R471 Dysarthria and anarthria: Secondary | ICD-10-CM | POA: Diagnosis present

## 2024-06-02 DIAGNOSIS — R569 Unspecified convulsions: Principal | ICD-10-CM | POA: Diagnosis present

## 2024-06-02 DIAGNOSIS — M8738 Other secondary osteonecrosis, other site: Secondary | ICD-10-CM | POA: Diagnosis present

## 2024-06-02 DIAGNOSIS — W19XXXA Unspecified fall, initial encounter: Secondary | ICD-10-CM

## 2024-06-02 DIAGNOSIS — Z7952 Long term (current) use of systemic steroids: Secondary | ICD-10-CM

## 2024-06-02 DIAGNOSIS — Z79899 Other long term (current) drug therapy: Secondary | ICD-10-CM

## 2024-06-02 DIAGNOSIS — T43215A Adverse effect of selective serotonin and norepinephrine reuptake inhibitors, initial encounter: Secondary | ICD-10-CM | POA: Diagnosis present

## 2024-06-02 DIAGNOSIS — R296 Repeated falls: Secondary | ICD-10-CM | POA: Diagnosis present

## 2024-06-02 DIAGNOSIS — R29810 Facial weakness: Secondary | ICD-10-CM | POA: Diagnosis present

## 2024-06-02 DIAGNOSIS — M549 Dorsalgia, unspecified: Secondary | ICD-10-CM | POA: Diagnosis present

## 2024-06-02 DIAGNOSIS — G309 Alzheimer's disease, unspecified: Secondary | ICD-10-CM | POA: Diagnosis present

## 2024-06-02 DIAGNOSIS — N182 Chronic kidney disease, stage 2 (mild): Secondary | ICD-10-CM | POA: Diagnosis present

## 2024-06-02 DIAGNOSIS — D631 Anemia in chronic kidney disease: Secondary | ICD-10-CM | POA: Diagnosis present

## 2024-06-02 DIAGNOSIS — E1140 Type 2 diabetes mellitus with diabetic neuropathy, unspecified: Secondary | ICD-10-CM | POA: Diagnosis present

## 2024-06-02 DIAGNOSIS — Z9181 History of falling: Secondary | ICD-10-CM

## 2024-06-02 DIAGNOSIS — R1310 Dysphagia, unspecified: Secondary | ICD-10-CM | POA: Diagnosis present

## 2024-06-02 DIAGNOSIS — Z8249 Family history of ischemic heart disease and other diseases of the circulatory system: Secondary | ICD-10-CM

## 2024-06-02 DIAGNOSIS — G928 Other toxic encephalopathy: Secondary | ICD-10-CM | POA: Diagnosis present

## 2024-06-02 DIAGNOSIS — K219 Gastro-esophageal reflux disease without esophagitis: Secondary | ICD-10-CM | POA: Diagnosis present

## 2024-06-02 LAB — APTT: aPTT: 29 s (ref 24–36)

## 2024-06-02 LAB — COMPREHENSIVE METABOLIC PANEL WITH GFR
ALT: 18 U/L (ref 0–44)
AST: 24 U/L (ref 15–41)
Albumin: 3 g/dL — ABNORMAL LOW (ref 3.5–5.0)
Alkaline Phosphatase: 73 U/L (ref 38–126)
Anion gap: 5 (ref 5–15)
BUN: 22 mg/dL (ref 8–23)
CO2: 29 mmol/L (ref 22–32)
Calcium: 8.8 mg/dL — ABNORMAL LOW (ref 8.9–10.3)
Chloride: 107 mmol/L (ref 98–111)
Creatinine, Ser: 1.4 mg/dL — ABNORMAL HIGH (ref 0.61–1.24)
GFR, Estimated: 52 mL/min — ABNORMAL LOW (ref >60)
Glucose, Bld: 93 mg/dL (ref 70–99)
Potassium: 4.3 mmol/L (ref 3.5–5.1)
Sodium: 141 mmol/L (ref 135–145)
Total Bilirubin: 0.9 mg/dL (ref 0.0–1.2)
Total Protein: 6.4 g/dL — ABNORMAL LOW (ref 6.5–8.1)

## 2024-06-02 LAB — ETHANOL: Alcohol, Ethyl (B): <15 mg/dL (ref <15)

## 2024-06-02 LAB — I-STAT CHEM 8, ED
BUN: 26 mg/dL — ABNORMAL HIGH (ref 8–23)
Calcium, Ion: 1.14 mmol/L — ABNORMAL LOW (ref 1.15–1.40)
Chloride: 103 mmol/L (ref 98–111)
Creatinine, Ser: 1.4 mg/dL — ABNORMAL HIGH (ref 0.61–1.24)
Glucose, Bld: 88 mg/dL (ref 70–99)
HCT: 38 % — ABNORMAL LOW (ref 39.0–52.0)
Hemoglobin: 12.9 g/dL — ABNORMAL LOW (ref 13.0–17.0)
Potassium: 4.3 mmol/L (ref 3.5–5.1)
Sodium: 142 mmol/L (ref 135–145)
TCO2: 26 mmol/L (ref 22–32)

## 2024-06-02 LAB — CBG MONITORING, ED: Glucose-Capillary: 86 mg/dL (ref 70–99)

## 2024-06-02 LAB — CBC
HCT: 39 % (ref 39.0–52.0)
Hemoglobin: 12.7 g/dL — ABNORMAL LOW (ref 13.0–17.0)
MCH: 31.4 pg (ref 26.0–34.0)
MCHC: 32.6 g/dL (ref 30.0–36.0)
MCV: 96.5 fL (ref 80.0–100.0)
Platelets: 117 K/uL — ABNORMAL LOW (ref 150–400)
RBC: 4.04 MIL/uL — ABNORMAL LOW (ref 4.22–5.81)
RDW: 12.9 % (ref 11.5–15.5)
WBC: 5.1 K/uL (ref 4.0–10.5)
nRBC: 0 % (ref 0.0–0.2)

## 2024-06-02 LAB — PROTIME-INR
INR: 1.1 (ref 0.8–1.2)
Prothrombin Time: 14.3 s (ref 11.4–15.2)

## 2024-06-02 LAB — DIFFERENTIAL
Abs Immature Granulocytes: 0.02 K/uL (ref 0.00–0.07)
Basophils Absolute: 0 K/uL (ref 0.0–0.1)
Basophils Relative: 1 %
Eosinophils Absolute: 0.2 K/uL (ref 0.0–0.5)
Eosinophils Relative: 4 %
Immature Granulocytes: 0 %
Lymphocytes Relative: 15 %
Lymphs Abs: 0.8 K/uL (ref 0.7–4.0)
Monocytes Absolute: 0.3 K/uL (ref 0.1–1.0)
Monocytes Relative: 6 %
Neutro Abs: 3.8 K/uL (ref 1.7–7.7)
Neutrophils Relative %: 74 %

## 2024-06-02 MED ORDER — IOHEXOL 350 MG/ML SOLN
75.0000 mL | Freq: Once | INTRAVENOUS | Status: AC | PRN
  Administered 2024-06-02: 75 mL via INTRAVENOUS

## 2024-06-02 MED ORDER — POLYETHYLENE GLYCOL 3350 17 G PO PACK
17.0000 g | PACK | Freq: Every day | ORAL | Status: DC
  Administered 2024-06-03 – 2024-06-04 (×2): 17 g via ORAL
  Filled 2024-06-02 (×3): qty 1

## 2024-06-02 MED ORDER — ENOXAPARIN SODIUM 40 MG/0.4ML IJ SOSY
40.0000 mg | PREFILLED_SYRINGE | INTRAMUSCULAR | Status: DC
  Administered 2024-06-02 – 2024-06-03 (×2): 40 mg via SUBCUTANEOUS
  Filled 2024-06-02: qty 0.4

## 2024-06-02 MED ORDER — SODIUM CHLORIDE 0.9% FLUSH
3.0000 mL | Freq: Once | INTRAVENOUS | Status: AC
  Administered 2024-06-02: 3 mL via INTRAVENOUS

## 2024-06-02 MED ORDER — MELATONIN 3 MG PO TABS
3.0000 mg | ORAL_TABLET | Freq: Every day | ORAL | Status: DC
  Administered 2024-06-02 – 2024-06-03 (×2): 3 mg via ORAL
  Filled 2024-06-02 (×2): qty 1

## 2024-06-02 MED ORDER — LACTATED RINGERS IV BOLUS
1000.0000 mL | Freq: Once | INTRAVENOUS | Status: AC
  Administered 2024-06-02: 1000 mL via INTRAVENOUS

## 2024-06-02 MED ORDER — LEVETIRACETAM (KEPPRA) 500 MG/5 ML ADULT IV PUSH
500.0000 mg | Freq: Two times a day (BID) | INTRAVENOUS | Status: DC
  Administered 2024-06-02 – 2024-06-04 (×5): 500 mg via INTRAVENOUS
  Filled 2024-06-02 (×5): qty 5

## 2024-06-02 MED ORDER — PANTOPRAZOLE SODIUM 40 MG PO TBEC
40.0000 mg | DELAYED_RELEASE_TABLET | Freq: Every day | ORAL | Status: DC
  Administered 2024-06-02 – 2024-06-04 (×3): 40 mg via ORAL
  Filled 2024-06-02 (×3): qty 1

## 2024-06-02 MED ORDER — ACETAMINOPHEN 325 MG PO TABS
650.0000 mg | ORAL_TABLET | Freq: Four times a day (QID) | ORAL | Status: DC | PRN
  Administered 2024-06-02 – 2024-06-03 (×2): 650 mg via ORAL
  Filled 2024-06-02 (×2): qty 2

## 2024-06-02 MED ORDER — SENNOSIDES-DOCUSATE SODIUM 8.6-50 MG PO TABS
1.0000 | ORAL_TABLET | Freq: Every day | ORAL | Status: DC
  Administered 2024-06-03 (×2): 1 via ORAL
  Filled 2024-06-02 (×2): qty 1

## 2024-06-02 MED ORDER — VITAMIN D 25 MCG (1000 UNIT) PO TABS
2000.0000 [IU] | ORAL_TABLET | Freq: Every day | ORAL | Status: DC
  Administered 2024-06-02 – 2024-06-04 (×3): 2000 [IU] via ORAL
  Filled 2024-06-02 (×3): qty 2

## 2024-06-02 MED ORDER — ACETAMINOPHEN 650 MG RE SUPP: 650.0000 mg | Freq: Four times a day (QID) | RECTAL | Status: DC | PRN

## 2024-06-02 MED ORDER — TRAZODONE HCL 50 MG PO TABS
50.0000 mg | ORAL_TABLET | Freq: Every evening | ORAL | Status: DC | PRN
  Administered 2024-06-02 – 2024-06-03 (×2): 50 mg via ORAL
  Filled 2024-06-02 (×2): qty 1

## 2024-06-02 MED ORDER — ATORVASTATIN CALCIUM 40 MG PO TABS
40.0000 mg | ORAL_TABLET | Freq: Every day | ORAL | Status: DC
  Administered 2024-06-02 – 2024-06-04 (×3): 40 mg via ORAL
  Filled 2024-06-02 (×3): qty 1

## 2024-06-02 NOTE — Evaluation (Signed)
 Physical Therapy Evaluation Patient Details Name: Ronnie Strickland MRN: [id number] DOB: 1950-12-03 Today's Date: 06/02/2024  History of Present Illness  Ronnie Strickland is a 73 y.o. male who presented to [facility] ED 06/02/24 as a CODE STROKE. Pt had a fall at home and struck his head against a dresser. Presented with  weakness, facial droop, and trouble speaking. CT Head negative, CTA with no LVO, and MRI Brain negative. 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   Clinical Impression  Pt admitted with above diagnosis. PTA, pt required assistance with functional mobility, ADLs, and IADLs. His wife reports pt ambulated short distance using RW and would utilize w/c in the community or whenever he had an onset of dizziness. Pt currently with functional limitations due to the deficits listed below (see PT Problem List). He required CGA for bed mobility, CGA for sit<>stand, and min-modA for ambulation attempt. Gait distance was limited by sudden onset of worsening dizziness and pt demonstrating a significant posterior lean. Facilitated seated rest and deferred further OOB mobility attempts. Pt is currently limited by impaired cognition, decreased safety awareness, impaired balance, symptomatic hypotension, and decreased activity tolerance. Pt will benefit from acute skilled PT to increase his independence and safety with mobility to allow discharge. Anticipate pt to progress well once BP is controlled. Recommend HHPT to maintain strength, improve balance, decrease fall risk, advance activity tolerance, and optimize safety within the home environment.     06/02/24 1500  Vitals  Patient Position (if appropriate) Orthostatic Vitals  Orthostatic Lying   BP- Lying (!) 153/100  Pulse- Lying 65  Orthostatic Sitting  BP- Sitting 117/82  Pulse- Sitting 68  Orthostatic Standing at 0 minutes  BP- Standing at 0 minutes  (Pt  unable to maintain static stance long enough for BP reading to be achieved. He c/o worsening dizziness and demonstrated posterior lean.)            If plan is discharge home, recommend the following: A lot of help with walking and/or transfers;A lot of help with bathing/dressing/bathroom;Assistance with cooking/housework;Assist for transportation;Help with stairs or ramp for entrance;Supervision due to cognitive status;Direct supervision/assist for medications management   Can travel by private vehicle        Equipment Recommendations None recommended by PT;BSC/3in1  Recommendations for Other Services       Functional Status Assessment Patient has had a recent decline in their functional status and demonstrates the ability to make significant improvements in function in a reasonable and predictable amount of time.     Precautions / Restrictions Precautions Precautions: Fall Recall of Precautions/Restrictions: Impaired Restrictions Weight Bearing Restrictions Per Provider Order: No      Mobility  Bed Mobility Overal bed mobility: Needs Assistance Bed Mobility: Supine to Sit, Sit to Supine     Supine to sit: Contact guard, HOB elevated Sit to supine: Contact guard assist, HOB elevated   General bed mobility comments: Pt sat up on R side of stretcher with increased time. He brought BLE off EOB. Assist to elevate trunk and scoot hips fwd. Returning to bed slight assist with BLE.    Transfers Overall transfer level: Needs assistance Equipment used: 1 person hand held assist Transfers: Sit to/from Stand Sit to Stand: Contact guard assist           General transfer comment: Pt stood from lowest bed height pushing up with BUE support. He powered up with CGA. Good  eccentric control.    Ambulation/Gait Ambulation/Gait assistance: Min assist, Mod assist Gait Distance (Feet): 5 Feet Assistive device: 1 person hand held assist Gait Pattern/deviations: Step-to pattern,  Decreased step length - right, Decreased step length - left, Decreased stride length, Drifts right/left, Leaning posteriorly       General Gait Details: Pt attempted to walk to door of room. He took short slow steps and was unsteady. 1 HHA provided. Pt c/o sudden dizziness. Facilitated seated rest break on edge of stretcher. After rest, guided pt in side steps to return the [facility]. Pt with poor motor control/planning, difficulty placing feet appropriately and posterior lean noted. Increased physical assist as pt fatigued.  Stairs            Wheelchair Mobility     Tilt Bed    Modified Rankin (Stroke Patients Only)       Balance Overall balance assessment: Needs assistance, History of Falls Sitting-balance support: Bilateral upper extremity supported, Feet supported Sitting balance-Leahy Scale: Fair     Standing balance support: Single extremity supported, During functional activity Standing balance-Leahy Scale: Poor Standing balance comment: Pt unsteady with poor upright tolerance d/t onset of dizziness. Required min-modA when taking steps.                             Pertinent Vitals/Pain Pain Assessment Pain Assessment: PAINAD Breathing: normal Negative Vocalization: occasional moan/groan, low speech, negative/disapproving quality Facial Expression: smiling or inexpressive Body Language: relaxed Consolability: no need to console PAINAD Score: 1 Pain Intervention(s): Monitored during session, Limited activity within patient's tolerance, Repositioned    Home Living Family/patient expects to be discharged to:: Private residence Living Arrangements: Spouse/significant other;Other relatives (Grandkids (4, 6, and 2 y/o)) Available Help at Discharge: Family;Available 24 hours/day Type of Home: House Home Access: Stairs to enter;Ramped entrance Entrance Stairs-Rails: None Entrance Stairs-Number of Steps: 2   Home Layout: Two level;Able to live on main level  with bedroom/bathroom Home Equipment: Shower seat;Cane - single point;Rollator (4 wheels);Wheelchair - manual;Grab bars - tub/shower;Grab bars - toilet;Hand held shower head;Other (comment) (Adjustable bed frame)      Prior Function Prior Level of Function : Needs assist  Cognitive Assist : Mobility (cognitive);ADLs (cognitive) Mobility (Cognitive): Step by step cues ADLs (Cognitive): Step by step cues Physical Assist : Mobility (physical);ADLs (physical) Mobility (physical): Transfers;Gait;Stairs ADLs (physical): Bathing;Dressing;Toileting;IADLs Mobility Comments: Spouse reports pt can sit up on the EOB. He always has 1+ assistance whenever he is up and moving using the rollator. Uses the w/c whenever he goes into the community and/or there is an onset of dizziness. Multiple falls in the past 62mo. Spouse reports an OT from the [facility] informed her to use the w/c all the time. ADLs Comments: Spouse reports she helps him with all self-care. Pt will get into the shower to bath with assist while sitting in the shower chair and with wife present the whole time. Pt is able to feed himself and brush his teeth. Spouse manages medications and all other IADLs including transport.     Extremity/Trunk Assessment   Upper Extremity Assessment Upper Extremity Assessment: Defer to OT evaluation    Lower Extremity Assessment Lower Extremity Assessment: Overall WFL for tasks assessed    Cervical / Trunk Assessment Cervical / Trunk Assessment: Normal  Communication   Communication Communication: Impaired Factors Affecting Communication: Difficulty expressing self;Reduced clarity of speech    Cognition Arousal: Alert Behavior During Therapy: [facility] for tasks assessed/performed  PT - Cognitive impairments: History of cognitive impairments (Dementia)                       PT - Cognition Comments: Pt A,O to himself. Wife and granddaughter do not believe pt recognizes his surroundings. Pt with  visual hallucination noted during session. Following commands: Impaired Following commands impaired: Follows one step commands inconsistently, Follows one step commands with increased time     Cueing Cueing Techniques: Verbal cues, Gestural cues, Tactile cues, Visual cues     General Comments General comments (skin integrity, edema, etc.): Attempted to assess orthostatic vitals. Pt unable to maintain static stance for BP reading. He c/o dizziness during gait attempt.    Exercises     Assessment/Plan    PT Assessment Patient needs continued PT services  PT Problem List Decreased strength;Decreased range of motion;Decreased activity tolerance;Decreased balance;Decreased mobility;Decreased cognition;Decreased knowledge of use of DME;Decreased safety awareness       PT Treatment Interventions DME instruction;Gait training;Functional mobility training;Therapeutic activities;Therapeutic exercise;Balance training;Patient/family education    PT Goals (Current goals can be found in the Care Plan section)  Acute Rehab PT Goals Patient Stated Goal: Wife reports to return Home PT Goal Formulation: With patient/family Time For Goal Achievement: 06/16/24 Potential to Achieve Goals: Fair Additional Goals Additional Goal #1: Pt will propel manual w/c ~16ft with supervision.    Frequency Min 2X/week     Co-evaluation               AM-PAC PT 6 Clicks Mobility  Outcome Measure Help needed turning from your back to your side while in a flat bed without using bedrails?: A Little Help needed moving from lying on your back to sitting on the side of a flat bed without using bedrails?: A Little Help needed moving to and from a bed to a chair (including a wheelchair)?: A Lot Help needed standing up from a chair using your arms (e.g., wheelchair or bedside chair)?: A Little Help needed to walk in hospital room?: Total Help needed climbing 3-5 steps with a railing? : Total 6 Click Score:  13    End of Session Equipment Utilized During Treatment: Gait belt Activity Tolerance: Patient tolerated treatment well;Patient limited by fatigue Patient left: in bed;with call bell/phone within reach;with family/visitor present Nurse Communication: Mobility status;Other (comment) (BP reponse during session.) PT Visit Diagnosis: Muscle weakness (generalized) (M62.81);History of falling (Z91.81);Repeated falls (R29.6);Other abnormalities of gait and mobility (R26.89);Difficulty in walking, not elsewhere classified (R26.2);Unsteadiness on feet (R26.81)    Time: 8475-8451 PT Time Calculation (min) (ACUTE ONLY): 24 min   Charges:   PT Evaluation $PT Eval Moderate Complexity: 1 Mod   PT General Charges $$ ACUTE PT VISIT: 1 Visit         Randall SAUNDERS, PT, DPT Acute Rehabilitation Services Office: [phone] Secure Chat Preferred  Delon CHRISTELLA Callander 06/02/2024, 4:47 PM

## 2024-06-02 NOTE — ED Provider Notes (Signed)
 [facility] AT [facility] Provider Note  CSN: [id number] Arrival date & time: 06/02/24 1142  Chief Complaint(s) Code Stroke  HPI Ronnie Strickland is a 73 y.o. male history of diabetes, hyperlipidemia, dementia.  Patient was at home, had unwitnessed fall, family were in the next room.  Seem to struck head against a dresser.  Family called paramedics.  Was noted that the patient seemed to have possibly some right sided weakness and facial droop and trouble speaking.  Patient was activated as a code stroke and brought to the ER.  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 Authorizing Provider  ARTIFICIAL SALIVA MT Use as directed 1 application in the mouth or throat every 4 (four) hours as needed (dry mouth).    [provider]  atorvastatin  (LIPITOR) 80 MG tablet Take 40 mg by mouth daily.     [provider]  Cholecalciferol (VITAMIN D3) 1000 units CAPS Take 2,000 Units by mouth daily.     [provider]  diclofenac  sodium (VOLTAREN ) 1 % GEL Apply 4 g topically 3 (three) times daily as needed (pain).    [provider]  fludrocortisone  (FLORINEF ) 0.1 MG tablet Take 0.1 mg by mouth daily.    [provider]  latanoprost  (XALATAN ) 0.005 % ophthalmic solution Place 1 drop into both eyes at bedtime.    [provider]  levETIRAcetam  (KEPPRA ) 500 MG tablet Take 1 tablet (500 mg total) by mouth 2 (two) times daily. 07/01/23 09/29/23  Suzanne Kirsch, MD  midodrine  (PROAMATINE ) 5 MG tablet Take 10 mg by mouth 3 (three) times daily with meals.    [provider]  nystatin  (MYCOSTATIN ) 100000 UNIT/ML suspension Take 10 mLs by mouth 2 (two) times daily. Swish and swallow    [provider]  omeprazole (PRILOSEC) 20 MG capsule Take 20 mg by mouth 2 (two) times daily before a meal.    [provider]  oxyCODONE  (OXY IR/ROXICODONE ) 5 MG immediate release tablet Take 5 mg by mouth every 6 (six) hours as needed for severe pain.    [provider]  QUEtiapine  (SEROQUEL ) 100 MG tablet Take 100 mg by mouth at bedtime. Take 100mg  at bedtime for 14 days. Then take 50mg  at bedtime for 7 days. Then stop. 10/04/16   [provider]  sertraline  (ZOLOFT ) 100 MG tablet Take 200 mg by mouth daily.     [provider]  Past Surgical History Past Surgical History:  Procedure Laterality Date   COLONOSCOPY     COLONOSCOPY WITH PROPOFOL  N/A 03/21/2016   Procedure: COLONOSCOPY WITH PROPOFOL ;  Surgeon: Rogelia Copping, MD;  Location: [facility];  Service: Endoscopy;  Laterality: N/A;   ESOPHAGOGASTRODUODENOSCOPY (EGD) WITH PROPOFOL  N/A 03/21/2016   Procedure: ESOPHAGOGASTRODUODENOSCOPY (EGD) WITH PROPOFOL ;  Surgeon: Rogelia Copping, MD;  Location: [facility];  Service: Endoscopy;  Laterality: N/A;  diabetic  - diet controlled   KNEE SURGERY     VASECTOMY     Family History Family History  Problem Relation Age of Onset   Heart disease Mother    Arthritis Mother     Social History Social History   Tobacco Use   Smoking status: Former   Smokeless tobacco: Never  Substance Use Topics   Alcohol use: Yes    Comment: occasonal consumption   Drug use: No   Allergies Tomato  Review of Systems Review of Systems  All other systems reviewed and are negative.   Physical Exam Vital Signs  I have reviewed the triage vital signs BP (!) 156/98   Pulse 67   Temp 97.8 F (36.6 C) (Axillary)   Resp 18   Wt 72 kg   SpO2 100%   BMI 20.94 kg/m  Physical Exam Vitals and nursing note reviewed.  Constitutional:      General: He is not in acute distress.    Appearance: Normal appearance.  HENT:     Mouth/Throat:     Mouth: Mucous membranes are moist.  Eyes:     Conjunctiva/sclera: Conjunctivae normal.  Cardiovascular:     Rate and Rhythm: Normal rate and regular rhythm.  Pulmonary:     Effort: Pulmonary effort is normal. No respiratory distress.     Breath sounds: Normal breath sounds.  Abdominal:     General: Abdomen is flat.     Palpations: Abdomen is soft.     Tenderness: There is no abdominal tenderness.  Musculoskeletal:     Right lower leg: No edema.     Left lower leg: No edema.     Comments: No midline C, T, L-spine tenderness.  No chest wall tenderness or crepitus.  Full painless range of motion at the bilateral upper extremities including the shoulders, elbows, wrists, hand and fingers, and in the bilateral lower extremities including the hips, knees, ankle, toes.  No focal bony tenderness, injury or deformity.  Skin:    General: Skin is warm and dry.     Capillary Refill: Capillary refill takes less than 2 seconds.  Neurological:     Mental Status: He is alert.     Comments: Oriented to self, moves all 4 extremities but seems globally weak.  Some slight right facial  droop.  Psychiatric:        Mood and Affect: Mood normal.        Behavior: Behavior normal.     ED Results and Treatments Labs (all labs ordered are listed, but only abnormal results are displayed) Labs Reviewed  CBC - Abnormal; Notable for the following components:      Result Value   RBC 4.04 (*)    Hemoglobin 12.7 (*)    Platelets 117 (*)    All other components within normal limits  COMPREHENSIVE METABOLIC PANEL WITH GFR - Abnormal; Notable for the following components:   Creatinine, Ser 1.40 (*)    Calcium  8.8 (*)    Total Protein 6.4 (*)    Albumin 3.0 (*)  GFR, Estimated 52 (*)    All other components within normal limits  I-STAT CHEM 8, ED - Abnormal; Notable for the following components:   BUN 26 (*)    Creatinine, Ser 1.40 (*)    Calcium , Ion 1.14 (*)    Hemoglobin 12.9 (*)    HCT 38.0 (*)    All other components within normal limits  PROTIME-INR  APTT  DIFFERENTIAL  ETHANOL  LEVETIRACETAM  LEVEL  CBG MONITORING, ED                                                                                                                          Radiology MR BRAIN WO CONTRAST Result Date: 06/02/2024 EXAM: MRI BRAIN WITHOUT CONTRAST 06/02/2024 12:52:55 PM TECHNIQUE: Multiplanar multisequence MRI of the head/brain was performed without the administration of intravenous contrast. COMPARISON: Brain MRI 05/22/2023. CTA and CT head today reported separately. CLINICAL HISTORY: 73 year old male with mental status change, unknown cause; neuro deficit, acute, stroke suspected. FINDINGS: BRAIN AND VENTRICLES: Intermittent motion artifact despite repeated imaging attempts. Stable brain volume. No intracranial hemorrhage. No mass. No midline shift. No hydrocephalus. Small foci of cerebral white matter T2 and FLAIR hyperintensity in both hemispheres not significantly changed from last year. More moderate T2 and FLAIR heterogeneity in the pons does appear increased (series 8 image 8). No  convincing diffusion restriction. No cortical encephalomalacia. Negative deep gray nuclei and cerebellum. Chronic microhemorrhages in the parietal lobes are stable. Left external capsule microhemorrhage on series 5 image 59 is new from last year but appears chronic. Evidence of chronic small vessel disease, progressed in the left internal capsule and the pons since MRI last year. The sella is unremarkable. Normal flow voids. ORBITS: No acute abnormality. SINUSES AND MASTOIDS: Improved paranasal sinus and mastoid aeration. BONES AND SOFT TISSUES: Normal marrow signal. Normal visible cervical spine. No acute soft tissue abnormality. IMPRESSION: 1. No acute infarct or acute intracranial abnormality when allowing for motion artifact. 2. 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 Electronically signed by: Helayne Hurst MD 06/02/2024 01:14 PM EST RP Workstation: [id number]   CT ANGIO HEAD NECK W WO CM (CODE STROKE) Result Date: 06/02/2024 EXAM: CTA HEAD AND NECK WITH AND WITHOUT 06/02/2024 12:02:00 PM TECHNIQUE: CTA of the head and neck was performed with and without the administration of 75 mL iohexol (OMNIPAQUE) 350 MG/ML injection. Multiplanar 2D and/or 3D reformatted images are provided for review. Automated exposure control, iterative reconstruction, and/or weight based adjustment of the mA/kV was utilized to reduce the radiation dose to as low as reasonably achievable. Stenosis of the internal carotid arteries measured using NASCET criteria. COMPARISON: MRA neck 10/17/2016 CLINICAL HISTORY: Neuro deficit, acute, stroke suspected. Facial weakness and slurred speech. FINDINGS: CTA NECK: AORTIC ARCH AND ARCH VESSELS: Mild atherosclerotic calcification in the aortic arch and left subclavian artery. No dissection or arterial injury. No significant stenosis of the brachiocephalic or subclavian arteries. CERVICAL CAROTID  ARTERIES: Mixed calcified and soft  plaque at the carotid bifurcations, minimal on the right and mild on the left. No dissection, arterial injury, or hemodynamically significant stenosis by NASCET criteria. CERVICAL VERTEBRAL ARTERIES: Mildly to moderately dominant left vertebral artery. No dissection, arterial injury, or significant stenosis. LUNGS AND MEDIASTINUM: Unremarkable. SOFT TISSUES: Post treatment changes throughout the right neck. No evidence of cervical lymphadenopathy. BONES: Cervical disc degeneration which is most advanced at C5-C6 and C6-C7. CTA HEAD: ANTERIOR CIRCULATION: The intracranial internal carotid arteries are widely patent with minimal nonstenotic atherosclerosis. ACAs and MCAs are patent without evidence of a proximal branch occlusion or significant proximal stenosis. No aneurysm. POSTERIOR CIRCULATION: The intracranial vertebral arteries are widely patent to the basilar. Patent PICA and SCA origins are visualized bilaterally. The basilar artery is widely patent. Posterior communicating arteries are diminutive or absent. Both PCAs are patent without evidence of a significant proximal stenosis. No aneurysm. OTHER: No dural venous sinus thrombosis on this non-dedicated study. IMPRESSION: 1. Minimal atherosclerosis without a large vessel occlusion or significant proximal stenosis in the head or neck. 2. These results were communicated to Dr. CANDIE Mari at 12:12 PM on 06/02/2024 by secure text page via the [facility] messaging system. Electronically signed by: Dasie Hamburg MD 06/02/2024 12:19 PM EST RP Workstation: [id number]   CT HEAD CODE STROKE WO CONTRAST Result Date: 06/02/2024 EXAM: CT HEAD WITHOUT CONTRAST 06/02/2024 11:54:46 AM TECHNIQUE: CT of the head was performed without the administration of intravenous contrast. Automated exposure control, iterative reconstruction, and/or weight based adjustment of the mA/kV was utilized to reduce the radiation dose to as low as reasonably achievable. COMPARISON: Brain MRI  05/22/2023, Head CT 06/26/2023. CLINICAL HISTORY: 73 year old male with acute neuro deficit, stroke suspected. FINDINGS: BRAIN AND VENTRICLES: Brain volume remains normal for age. No acute hemorrhage. No evidence of acute infarct. No hydrocephalus. No extra-axial collection. No mass effect or midline shift. Gray white differentiation is stable with mild left periventricular white matter hypodensity. Minimal for age white matter changes overall. No suspicious intracranial vascular hyperdensity. ORBITS: No gaze deviation. No acute abnormality. SINUSES: Partial chronic opacification of the right middle ear, but right mastoid aeration appears mildly improved since last year. Paranasal sinuses, left middle ear and mastoid remain well aerated. SOFT TISSUES AND SKULL: Faint calcified atherosclerosis at the skull base. No acute soft tissue abnormality. No skull fracture. [place] stroke program early CT score (aspects) ----- Ganglionic (caudate, ic, lentiform nucleus, insula, M1-m3): 7 Supraganglionic (m4-m6): 3 Total: 10 IMPRESSION: 1. Stable mild for age cerebral white matter changes. ASPECTS 10. 2. These results were communicated to Doctor Mari at 1203 hours on 06/02/2024 by text page via the [facility] messaging system. Electronically signed by: Helayne Hurst MD 06/02/2024 12:05 PM EST RP Workstation: [id number]    Pertinent labs & imaging results that were available during my care of the patient were reviewed by me and considered in my medical decision making (see MDM for details).  Medications Ordered in ED Medications  levETIRAcetam  (KEPPRA ) undiluted injection 500 mg (500 mg Intravenous Given 06/02/24 1310)  sodium chloride  flush (NS) 0.9 % injection 3 mL (3 mLs Intravenous Given 06/02/24 1310)  iohexol (OMNIPAQUE) 350 MG/ML injection 75 mL (75 mLs Intravenous Contrast Given 06/02/24 1205)  Procedures .Critical Care  Performed by: Francesca Elsie CROME, MD Authorized by: Francesca Elsie CROME, MD   Critical care provider statement:    Critical care time (minutes):  30   Critical care was necessary to treat or prevent imminent or life-threatening deterioration of the following conditions:  CNS failure or compromise   Critical care was time spent personally by me on the following activities:  Development of treatment plan with patient or surrogate, discussions with consultants, evaluation of patient's response to treatment, examination of patient, ordering and review of laboratory studies, ordering and review of radiographic studies, ordering and performing treatments and interventions, pulse oximetry, re-evaluation of patient's condition and review of old charts   Care discussed with: admitting provider     (including critical care time)  Medical Decision Making / ED Course   MDM:  73 year old presenting after head injury, found to have episode of decreased responsiveness, not following commands, aphasia.  Given symptoms patient was activated as a code stroke.  He was evaluated by neurology on arrival.  TNK was considered, but patient's symptoms were felt to be unlikely due to stroke.  He underwent MRI which had negative diffusion weighted imaging for stroke.  He was seen by neurology who recommended 24-hour EEG monitoring, initiation of Keppra .  Patient had similar episode apparently around 1 year ago and was started on Keppra .  Family reported he seems more similar to his baseline now but is still not quite at himself and more slow to respond.  Although patient did not fall and hit his head, there are no signs of trauma on exam and a CT head and CT head and neck negative for acute fracture or intracranial bleeding.  Will discuss with hospitalist.   Clinical Course as of 06/02/24 1320  Thu Jun 02, 2024  1319 Signed out to the IM team for admission [WS]    Clinical  Course User Index [WS] Francesca, Elsie CROME, MD     Additional history obtained: -Additional history obtained from family and ems -External records from outside source obtained and reviewed including: Chart review including previous notes, labs, imaging, consultation notes including prior notes    Lab Tests: -I ordered, reviewed, and interpreted labs.   The pertinent results include:   Labs Reviewed  CBC - Abnormal; Notable for the following components:      Result Value   RBC 4.04 (*)    Hemoglobin 12.7 (*)    Platelets 117 (*)    All other components within normal limits  COMPREHENSIVE METABOLIC PANEL WITH GFR - Abnormal; Notable for the following components:   Creatinine, Ser 1.40 (*)    Calcium  8.8 (*)    Total Protein 6.4 (*)    Albumin 3.0 (*)    GFR, Estimated 52 (*)    All other components within normal limits  I-STAT CHEM 8, ED - Abnormal; Notable for the following components:   BUN 26 (*)    Creatinine, Ser 1.40 (*)    Calcium , Ion 1.14 (*)    Hemoglobin 12.9 (*)    HCT 38.0 (*)    All other components within normal limits  PROTIME-INR  APTT  DIFFERENTIAL  ETHANOL  LEVETIRACETAM  LEVEL  CBG MONITORING, ED    Notable for mild elevated cr, mild anemia   Imaging Studies ordered: I ordered imaging studies including MRI brain On my interpretation imaging demonstrates no acute stroke I independently visualized and interpreted imaging. I agree with the radiologist interpretation   Medicines ordered and prescription  drug management: Meds ordered this encounter  Medications   sodium chloride  flush (NS) 0.9 % injection 3 mL   iohexol  (OMNIPAQUE ) 350 MG/ML injection 75 mL   levETIRAcetam  (KEPPRA ) undiluted injection 500 mg    -I have reviewed the patients home medicines and have made adjustments as needed   Consultations Obtained: I requested consultation with the neurologist,  and discussed lab and imaging findings as well as pertinent plan - they recommend:  eeg     Reevaluation: After the interventions noted above, I reevaluated the patient and found that their symptoms have improved  Co morbidities that complicate the patient evaluation  Past Medical History:  Diagnosis Date   Diabetes mellitus type 2, diet-controlled (HCC)    GERD (gastroesophageal reflux disease)    Gout    H/O knee surgery    Hyperlipidemia    Oral cancer (HCC)       Dispostion: Disposition decision including need for hospitalization was considered, and patient admitted to the hospital.    Final Clinical Impression(s) / ED Diagnoses Final diagnoses:  Altered mental status, unspecified altered mental status type     This chart was dictated using voice recognition software.  Despite best efforts to proofread,  errors can occur which can change the documentation meaning.    Francesca Elsie CROME, MD 06/02/24 1320

## 2024-06-02 NOTE — Progress Notes (Signed)
 OT Cancellation Note  Patient Details Name: Ronnie Strickland MRN: [id number] DOB: 20-Jun-1951   Cancelled Treatment:    Reason Eval/Treat Not Completed: Medical issues which prohibited therapy (Pt with symptomatic orthostatic hypotension with PT. He is being transferred out of ED to a room. OT will follow.)  Kennth Mliss Helling 06/02/2024, 3:52 PM Mliss HERO, OTR/L Acute Rehabilitation Services Office: [phone]

## 2024-06-02 NOTE — H&P (Addendum)
 Date: 06/02/2024               Patient Name:  Ronnie Strickland MRN: [id number]  DOB: 1950/12/08 Age / Sex: 73 y.o., male   PCP: [facility]         Medical Service: Internal Medicine Teaching Service         Attending Physician: Dr. Jone Dauphin      First Contact: Letha Cheadle, MD    Second Contact: Dr. Lonni Africa, DO         Pager Information: First Contact Pager: [phone]   Second Contact Pager: [phone]   SUBJECTIVE   Chief Complaint:   History of Present Illness: Ronnie Strickland is a 73 y.o. 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 He was using a walker when family members reportedly heard a fall and found him on the ground having struck his head against a dresser. EMS were called when he was noted to not be following commands, and he arrived to the ED as a code stroke.  The patient states the last thing he remembers is lying on the floor. He does not recall the events preceding his fall. Upon awakening, he reports back pain. He denies nausea, visual disturbances, chest pain, vertigo, or shortness of breath. He endorses headache located in the posterior head region, weakness, fatigue, and tinnitus.  Family reports several weeks of leg shaking when attempting to stand. They also note recurrent falls. His family members report that his last fall was approximately two weeks ago, but on average he falls weekly. Of note, his family reports that he has a history of dizziness and wonder if that could have caused his fall.  ED Course:  Labs significant for  Abnormal Labs Reviewed  CBC - Abnormal; Notable for the following components:      Result Value   RBC 4.04 (*)    Hemoglobin 12.7 (*)    Platelets 117 (*)    All other components within normal limits  COMPREHENSIVE METABOLIC PANEL  WITH GFR - Abnormal; Notable for the following components:   Creatinine, Ser 1.40 (*)    Calcium  8.8 (*)    Total Protein 6.4 (*)    Albumin 3.0 (*)    GFR, Estimated 52 (*)    All other components within normal limits  I-STAT CHEM 8, ED - Abnormal; Notable for the following components:   BUN 26 (*)    Creatinine, Ser 1.40 (*)    Calcium , Ion 1.14 (*)    Hemoglobin 12.9 (*)    HCT 38.0 (*)    All other components within normal limits   Imaging  MR BRAIN WO CONTRAST Result Date: 06/02/2024 EXAM: MRI BRAIN WITHOUT CONTRAST 06/02/2024 12:52:55 PM TECHNIQUE: Multiplanar multisequence MRI of the head/brain was performed without the administration of intravenous contrast. COMPARISON: Brain MRI 05/22/2023. CTA and CT head today reported separately. CLINICAL HISTORY: 73 year old male with mental status change, unknown cause; neuro deficit, acute, stroke suspected. FINDINGS: BRAIN AND VENTRICLES: Intermittent motion artifact despite repeated imaging attempts. Stable brain volume. No intracranial hemorrhage. No mass. No midline shift. No hydrocephalus. Small foci of cerebral white matter T2 and FLAIR hyperintensity in both hemispheres not significantly changed from last year. More moderate T2 and FLAIR heterogeneity in the pons does appear increased (series 8 image 8). No convincing diffusion restriction. No cortical encephalomalacia.  Negative deep gray nuclei and cerebellum. Chronic microhemorrhages in the parietal lobes are stable. Left external capsule microhemorrhage on series 5 image 59 is new from last year but appears chronic. Evidence of chronic small vessel disease, progressed in the left internal capsule and the pons since MRI last year. The sella is unremarkable. Normal flow voids. ORBITS: No acute abnormality. SINUSES AND MASTOIDS: Improved paranasal sinus and mastoid aeration. BONES AND SOFT TISSUES: Normal marrow signal. Normal visible cervical spine. No acute soft tissue abnormality.  IMPRESSION: 1. No acute infarct or acute intracranial abnormality when allowing for motion artifact. 2. 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 Electronically signed by: Helayne Hurst MD 06/02/2024 01:14 PM EST RP Workstation: [id number]   CT ANGIO HEAD NECK W WO CM (CODE STROKE) Result Date: 06/02/2024 EXAM: CTA HEAD AND NECK WITH AND WITHOUT 06/02/2024 12:02:00 PM TECHNIQUE: CTA of the head and neck was performed with and without the administration of 75 mL iohexol (OMNIPAQUE) 350 MG/ML injection. Multiplanar 2D and/or 3D reformatted images are provided for review. Automated exposure control, iterative reconstruction, and/or weight based adjustment of the mA/kV was utilized to reduce the radiation dose to as low as reasonably achievable. Stenosis of the internal carotid arteries measured using NASCET criteria. COMPARISON: MRA neck 10/17/2016 CLINICAL HISTORY: Neuro deficit, acute, stroke suspected. Facial weakness and slurred speech. FINDINGS: CTA NECK: AORTIC ARCH AND ARCH VESSELS: Mild atherosclerotic calcification in the aortic arch and left subclavian artery. No dissection or arterial injury. No significant stenosis of the brachiocephalic or subclavian arteries. CERVICAL CAROTID ARTERIES: Mixed calcified and soft plaque at the carotid bifurcations, minimal on the right and mild on the left. No dissection, arterial injury, or hemodynamically significant stenosis by NASCET criteria. CERVICAL VERTEBRAL ARTERIES: Mildly to moderately dominant left vertebral artery. No dissection, arterial injury, or significant stenosis. LUNGS AND MEDIASTINUM: Unremarkable. SOFT TISSUES: Post treatment changes throughout the right neck. No evidence of cervical lymphadenopathy. BONES: Cervical disc degeneration which is most advanced at C5-C6 and C6-C7. CTA HEAD: ANTERIOR CIRCULATION: The intracranial internal carotid arteries are widely patent with minimal  nonstenotic atherosclerosis. ACAs and MCAs are patent without evidence of a proximal branch occlusion or significant proximal stenosis. No aneurysm. POSTERIOR CIRCULATION: The intracranial vertebral arteries are widely patent to the basilar. Patent PICA and SCA origins are visualized bilaterally. The basilar artery is widely patent. Posterior communicating arteries are diminutive or absent. Both PCAs are patent without evidence of a significant proximal stenosis. No aneurysm. OTHER: No dural venous sinus thrombosis on this non-dedicated study. IMPRESSION: 1. Minimal atherosclerosis without a large vessel occlusion or significant proximal stenosis in the head or neck. 2. These results were communicated to Dr. CANDIE Mari at 12:12 PM on 06/02/2024 by secure text page via the [facility] messaging system. Electronically signed by: Dasie Hamburg MD 06/02/2024 12:19 PM EST RP Workstation: [id number]   CT HEAD CODE STROKE WO CONTRAST Result Date: 06/02/2024 EXAM: CT HEAD WITHOUT CONTRAST 06/02/2024 11:54:46 AM TECHNIQUE: CT of the head was performed without the administration of intravenous contrast. Automated exposure control, iterative reconstruction, and/or weight based adjustment of the mA/kV was utilized to reduce the radiation dose to as low as reasonably achievable. COMPARISON: Brain MRI 05/22/2023, Head CT 06/26/2023. CLINICAL HISTORY: 73 year old male with acute neuro deficit, stroke suspected. FINDINGS: BRAIN AND VENTRICLES: Brain volume remains normal for age. No acute hemorrhage. No evidence of acute infarct. No hydrocephalus. No extra-axial collection. No mass effect or midline  shift. Elnor white differentiation is stable with mild left periventricular white matter hypodensity. Minimal for age white matter changes overall. No suspicious intracranial vascular hyperdensity. ORBITS: No gaze deviation. No acute abnormality. SINUSES: Partial chronic opacification of the right middle ear, but right mastoid aeration  appears mildly improved since last year. Paranasal sinuses, left middle ear and mastoid remain well aerated. SOFT TISSUES AND SKULL: Faint calcified atherosclerosis at the skull base. No acute soft tissue abnormality. No skull fracture. alberta stroke program early CT score (aspects) ----- Ganglionic (caudate, ic, lentiform nucleus, insula, M1-m3): 7 Supraganglionic (m4-m6): 3 Total: 10 IMPRESSION: 1. Stable mild for age cerebral white matter changes. ASPECTS 10. 2. These results were communicated to Doctor Vanessa at 1203 hours on 06/02/2024 by text page via the [facility] messaging system. Electronically signed by: Helayne Hurst MD 06/02/2024 12:05 PM EST RP Workstation: [id number]   Received  Medications Ordered in the ED  levETIRAcetam  (KEPPRA ) undiluted injection 500 mg (500 mg Intravenous Given 06/02/24 1310)  enoxaparin  (LOVENOX ) injection 40 mg (has no administration in time range)  polyethylene glycol (MIRALAX  / GLYCOLAX ) packet 17 g (has no administration in time range)  acetaminophen  (TYLENOL ) tablet 650 mg (has no administration in time range)    Or  acetaminophen  (TYLENOL ) suppository 650 mg (has no administration in time range)  pantoprazole (PROTONIX) EC tablet 40 mg (has no administration in time range)  atorvastatin  (LIPITOR) tablet 40 mg (has no administration in time range)  cholecalciferol (VITAMIN D3) 25 MCG (1000 UNIT) tablet 2,000 Units (has no administration in time range)  sodium chloride  flush (NS) 0.9 % injection 3 mL (3 mLs Intravenous Given 06/02/24 1310)  iohexol (OMNIPAQUE) 350 MG/ML injection 75 mL (75 mLs Intravenous Contrast Given 06/02/24 1205)   Consulted Neurology: Recommendations include overnight EEG monitoring, Keppra  level, Keppra  500 mg twice daily, seizure precautions, MRI brain, and neurology will continue to follow the patient.  Meds:  Patient reported: No outpatient medications have been marked as taking for the 06/02/24 encounter [facility] Encounter).    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  - Splenomegaly  - Rotator cuff syndrome  - REM sleep behavior disorder  - Primary malignant neoplasm of parotid gland  - Obstructive sleep apnea  - Gout - Gastroesophageal reflux disease - Type 2 diabetes mellitus with diabetic neuropathy - Essential hypertension  - Tinnitus of right ear - Headache  - Dysphagia  - Borderline glaucoma  - Chest pain  Past Surgical History Past Surgical History:  Procedure Laterality Date   COLONOSCOPY     COLONOSCOPY WITH PROPOFOL  N/A 03/21/2016   Procedure: COLONOSCOPY WITH PROPOFOL ;  Surgeon: Rogelia Copping, MD;  Location[facility];  Service: Endoscopy;  Laterality: N/A;   ESOPHAGOGASTRODUODENOSCOPY (EGD) WITH PROPOFOL  N/A 03/21/2016   Procedure: ESOPHAGOGASTRODUODENOSCOPY (EGD) WITH PROPOFOL ;  Surgeon: Rogelia Copping, MD;  Location: [facility];  Service: Endoscopy;  Laterality: N/A;  diabetic - diet controlled   KNEE SURGERY     VASECTOMY     Social:  Lives With: Lives at home with wife, his daughter, and his daughter's kids. Occupation: Unemployed Support: Patient is supported well by his family. Level of Function: Dependent in ADLs/IADLs PCP: [facility]  Substances: -Tobacco: None -Alcohol: None -Recreational Drug: None  Family History:  Family History  Problem Relation Age of Onset   Heart disease Mother  Arthritis Mother     Allergies: Allergies as of 06/02/2024 - Review Complete 06/02/2024  Allergen Reaction Noted   Tomato Hives 02/20/2016   Review of Systems: A complete ROS was negative except as per HPI.   OBJECTIVE:   Physical Exam: Blood pressure (!) 156/98, pulse 67, temperature 97.8 F (36.6 C), temperature source Axillary, resp. rate 18, weight 72 kg, SpO2 100%.  Physical  Exam Constitutional:      General: He is not in acute distress. HENT:     Head: Normocephalic and atraumatic.     Mouth/Throat:     Mouth: Mucous membranes are moist.  Eyes:     Pupils: Pupils are equal, round, and reactive to light.  Cardiovascular:     Rate and Rhythm: Normal rate and regular rhythm.     Pulses: Normal pulses.     Heart sounds: Normal heart sounds. No murmur heard.    No friction rub. No gallop.  Pulmonary:     Effort: Pulmonary effort is normal. No respiratory distress.     Breath sounds: Normal breath sounds. No wheezing, rhonchi or rales.  Skin:    General: Skin is warm and dry.     Capillary Refill: Capillary refill takes less than 2 seconds.  Neurological:     General: No focal deficit present.     Mental Status: He is oriented to person, place, and time. Mental status is at baseline.     Cranial Nerves: No cranial nerve deficit or facial asymmetry.     Sensory: Sensation is intact. No sensory deficit.     Motor: Motor function is intact. No weakness.     Coordination: Finger-Nose-Finger Test and Heel to [place] Test normal.     Deep Tendon Reflexes: Reflexes abnormal. Babinski sign absent on the right side. Babinski sign absent on the left side.     Reflex Scores:      Bicep reflexes are 3+ on the right side.      Patellar reflexes are 3+ on the right side and 3+ on the left side.    Comments: Negative for clonus  Psychiatric:        Mood and Affect: Mood normal.        Behavior: Behavior is not agitated, aggressive, hyperactive or combative. Behavior is cooperative.        Cognition and Memory: Cognition is not impaired.    Labs: CBC    Component Value Date/Time   WBC 5.1 06/02/2024 1143   RBC 4.04 (L) 06/02/2024 1143   HGB 12.9 (L) 06/02/2024 1145   HCT 38.0 (L) 06/02/2024 1145   PLT 117 (L) 06/02/2024 1143   MCV 96.5 06/02/2024 1143   MCH 31.4 06/02/2024 1143   MCHC 32.6 06/02/2024 1143   RDW 12.9 06/02/2024 1143   LYMPHSABS 0.8 06/02/2024  1143   MONOABS 0.3 06/02/2024 1143   EOSABS 0.2 06/02/2024 1143   BASOSABS 0.0 06/02/2024 1143    CMP     Component Value Date/Time   NA 142 06/02/2024 1145   K 4.3 06/02/2024 1145   CL 103 06/02/2024 1145   CO2 29 06/02/2024 1143   GLUCOSE 88 06/02/2024 1145   BUN 26 (H) 06/02/2024 1145   CREATININE 1.40 (H) 06/02/2024 1145   CALCIUM  8.8 (L) 06/02/2024 1143   PROT 6.4 (L) 06/02/2024 1143   ALBUMIN 3.0 (L) 06/02/2024 1143   AST 24 06/02/2024 1143   ALT 18 06/02/2024 1143   ALKPHOS 73 06/02/2024 1143   BILITOT 0.9  06/02/2024 1143   GFRNONAA 52 (L) 06/02/2024 1143   GFRAA >60 10/16/2016 2206   PT/INR  Latest Reference Range & Units 06/02/24 11:43  Prothrombin Time 11.4 - 15.2 seconds 14.3  INR 0.8 - 1.2  1.1   aPTT  Latest Reference Range & Units 06/02/24 11:43  APTT 24 - 36 seconds 29   Ethanol  Latest Reference Range & Units 06/02/24 11:43  Alcohol, Ethyl (B) <15 mg/dL <84   Glucose  Latest Reference Range & Units 06/02/24 11:43 06/02/24 11:45  Glucose 70 - 99 mg/dL 93 88   Blood Gas  Latest Reference Range & Units 06/02/24 11:45  TCO2 22 - 32 mmol/L 26   Imaging: MR BRAIN WO CONTRAST Result Date: 06/02/2024 EXAM: MRI BRAIN WITHOUT CONTRAST 06/02/2024 12:52:55 PM TECHNIQUE: Multiplanar multisequence MRI of the head/brain was performed without the administration of intravenous contrast. COMPARISON: Brain MRI 05/22/2023. CTA and CT head today reported separately. CLINICAL HISTORY: 73 year old male with mental status change, unknown cause; neuro deficit, acute, stroke suspected. FINDINGS: BRAIN AND VENTRICLES: Intermittent motion artifact despite repeated imaging attempts. Stable brain volume. No intracranial hemorrhage. No mass. No midline shift. No hydrocephalus. Small foci of cerebral white matter T2 and FLAIR hyperintensity in both hemispheres not significantly changed from last year. More moderate T2 and FLAIR heterogeneity in the pons does appear increased (series 8  image 8). No convincing diffusion restriction. No cortical encephalomalacia. Negative deep gray nuclei and cerebellum. Chronic microhemorrhages in the parietal lobes are stable. Left external capsule microhemorrhage on series 5 image 59 is new from last year but appears chronic. Evidence of chronic small vessel disease, progressed in the left internal capsule and the pons since MRI last year. The sella is unremarkable. Normal flow voids. ORBITS: No acute abnormality. SINUSES AND MASTOIDS: Improved paranasal sinus and mastoid aeration. BONES AND SOFT TISSUES: Normal marrow signal. Normal visible cervical spine. No acute soft tissue abnormality. IMPRESSION: 1. No acute infarct or acute intracranial abnormality when allowing for motion artifact. 2. 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 Electronically signed by: Helayne Hurst MD 06/02/2024 01:14 PM EST RP Workstation: [id number]   CT ANGIO HEAD NECK W WO CM (CODE STROKE) Result Date: 06/02/2024 EXAM: CTA HEAD AND NECK WITH AND WITHOUT 06/02/2024 12:02:00 PM TECHNIQUE: CTA of the head and neck was performed with and without the administration of 75 mL iohexol (OMNIPAQUE) 350 MG/ML injection. Multiplanar 2D and/or 3D reformatted images are provided for review. Automated exposure control, iterative reconstruction, and/or weight based adjustment of the mA/kV was utilized to reduce the radiation dose to as low as reasonably achievable. Stenosis of the internal carotid arteries measured using NASCET criteria. COMPARISON: MRA neck 10/17/2016 CLINICAL HISTORY: Neuro deficit, acute, stroke suspected. Facial weakness and slurred speech. FINDINGS: CTA NECK: AORTIC ARCH AND ARCH VESSELS: Mild atherosclerotic calcification in the aortic arch and left subclavian artery. No dissection or arterial injury. No significant stenosis of the brachiocephalic or subclavian arteries. CERVICAL CAROTID ARTERIES: Mixed calcified  and soft plaque at the carotid bifurcations, minimal on the right and mild on the left. No dissection, arterial injury, or hemodynamically significant stenosis by NASCET criteria. CERVICAL VERTEBRAL ARTERIES: Mildly to moderately dominant left vertebral artery. No dissection, arterial injury, or significant stenosis. LUNGS AND MEDIASTINUM: Unremarkable. SOFT TISSUES: Post treatment changes throughout the right neck. No evidence of cervical lymphadenopathy. BONES: Cervical disc degeneration which is most advanced at C5-C6 and C6-C7. CTA HEAD: ANTERIOR CIRCULATION:  The intracranial internal carotid arteries are widely patent with minimal nonstenotic atherosclerosis. ACAs and MCAs are patent without evidence of a proximal branch occlusion or significant proximal stenosis. No aneurysm. POSTERIOR CIRCULATION: The intracranial vertebral arteries are widely patent to the basilar. Patent PICA and SCA origins are visualized bilaterally. The basilar artery is widely patent. Posterior communicating arteries are diminutive or absent. Both PCAs are patent without evidence of a significant proximal stenosis. No aneurysm. OTHER: No dural venous sinus thrombosis on this non-dedicated study. IMPRESSION: 1. Minimal atherosclerosis without a large vessel occlusion or significant proximal stenosis in the head or neck. 2. These results were communicated to Dr. CANDIE Mari at 12:12 PM on 06/02/2024 by secure text page via the [facility] messaging system. Electronically signed by: Dasie Hamburg MD 06/02/2024 12:19 PM EST RP Workstation: [id number]   CT HEAD CODE STROKE WO CONTRAST Result Date: 06/02/2024 EXAM: CT HEAD WITHOUT CONTRAST 06/02/2024 11:54:46 AM TECHNIQUE: CT of the head was performed without the administration of intravenous contrast. Automated exposure control, iterative reconstruction, and/or weight based adjustment of the mA/kV was utilized to reduce the radiation dose to as low as reasonably achievable. COMPARISON: Brain  MRI 05/22/2023, Head CT 06/26/2023. CLINICAL HISTORY: 73 year old male with acute neuro deficit, stroke suspected. FINDINGS: BRAIN AND VENTRICLES: Brain volume remains normal for age. No acute hemorrhage. No evidence of acute infarct. No hydrocephalus. No extra-axial collection. No mass effect or midline shift. Gray white differentiation is stable with mild left periventricular white matter hypodensity. Minimal for age white matter changes overall. No suspicious intracranial vascular hyperdensity. ORBITS: No gaze deviation. No acute abnormality. SINUSES: Partial chronic opacification of the right middle ear, but right mastoid aeration appears mildly improved since last year. Paranasal sinuses, left middle ear and mastoid remain well aerated. SOFT TISSUES AND SKULL: Faint calcified atherosclerosis at the skull base. No acute soft tissue abnormality. No skull fracture. alberta stroke program early CT score (aspects) ----- Ganglionic (caudate, ic, lentiform nucleus, insula, M1-m3): 7 Supraganglionic (m4-m6): 3 Total: 10 IMPRESSION: 1. Stable mild for age cerebral white matter changes. ASPECTS 10. 2. These results were communicated to Doctor Mari at 1203 hours on 06/02/2024 by text page via the [facility] messaging system. Electronically signed by: Helayne Hurst MD 06/02/2024 12:05 PM EST RP Workstation: [id number]     ASSESSMENT & PLAN:   Assessment & Plan by Problem: Principal Problem:   Stroke-like symptoms   Ronnie Strickland is a 73 y.o. 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  # Acute Neurologic Changes of Unclear Etiology and Seizure Rule Out The patient experienced acute neurologic changes of  unclear etiology with a broad differential. Stroke and intracranial hemorrhage are less likely given MRI showing no acute infarct and CT/CTA demonstrating no hemorrhage, no large vessel occlusion, and only minimal atherosclerosis in addition no evidence of acute focal neurologic deficits on physical exam, although 3+ reflexes. Coagulation studies are normal and platelets are chronically low but improved, lowering suspicion for spontaneous bleed. Given his history of dementia and chronic small-vessel ischemic disease, a focal seizure with postictal deficit remains a strong possibility. Syncope or orthostatic hypotension is also reasonable, especially in the setting of possible dehydration contributing to renal dysfunction, which could lead to transient cerebral hypoperfusion and neurologic symptoms. Cardiogenic syncope from  arrhythmia remains on the differential, which supports telemetry monitoring. Neurology was consulted, and will follow the patient. - Start Keppra  Injection 500 mg q 12 hrs  - Continuous overnight video EEG monitoring - Cardiac telemetry for arrhythmia evaluation - Serial neuro exams and NIH Stroke Scale monitoring - Seizure and fall precautions  #Fall From Standing Per family, a history of frequent falls. History suggests orthostatic etiology. On chart review, he was on midodrine  in the past. Fortunately he appears to have avoided serious injury from his fall. Suspect a degree of acute on chronic deconditioning. - Start Acetaminophen  tablet 650 mg q 6 hrs PRN - Orthostatic vital signs - Assess medicines that may contribute - Assess hydration status, and consider IV fluids if volume depleted - PT/OT evaluation  # Acute Kidney Injury vs Chronic Kidney Disease  Creatinine is 1.40 with GFR 52, which may represent baseline CKD given similar values previously, although a prerenal AKI is possible, especially if the patient is volume depleted. Low albumin may reflect nutritional issues or  chronic disease. Needs further assessment to differentiate AKI from CKD. - UA w/ microscopy - Monitor I/Os and daily weights - Assess volume status, and consider gentle IV fluids if indicated - Avoid nephrotoxins and renal-dose medications - Repeat CMP in AM to trend renal function  # Hyperlipidemia  - Atorvastatin  tablet 40 mg   # GERD - Pantoprazole tablet 40 mg daily  Best practice: Diet: NPO VTE: Enoxaparin  IVF: None,None Code: DNR/DNI  Disposition planning: Prior to Admission Living Arrangement: Home, living in [place], [place] Anticipated Discharge Location: Home or CIR   Dispo: Admit patient to Observation with expected length of stay less than 2 midnights.  Signed: Merilee Dwane RAMAN, Medical Student Internal Medicine Resident  06/02/2024, 3:53 PM  On Call pager: [phone]  Attestation for Student Documentation:  I personally was present and re-performed the history, physical exam and medical decision-making activities of this service and have verified that the service and findings are accurately documented in the student's note.  Harrie Bruckner, DO 06/02/2024, 6:02 PM

## 2024-06-02 NOTE — ED Triage Notes (Signed)
 PT BIB [facility] from home after PT fell and hit head against dresser. No LOC noted by family. Baseline alteration immediately following incident, PT aphasic and exhibiting R side weakness and facial droop. Pt not on thinners.   EMS VS: 164/114 BP, Hr 70, 99% Spo2 RA

## 2024-06-02 NOTE — Consult Note (Signed)
 NEUROLOGY CONSULT NOTE   Date of service: June 02, 2024 Patient Name: Cola Highfill MRN:  [id number] DOB:  May 11, 1951 Chief Complaint: Right facial weakness, inability to follow commands and slurred speech Requesting Provider: Francesca Elsie CROME, MD  History of Present Illness  Seger Jani is a 73 y.o. 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  Afterwards, he was noted to have right facial weakness, was not able to follow commands and had slurred speech.  On exam in the ED, he is able to follow commands and has drift in all 4 extremities with right facial weakness and questionable right sided sensory deficit/extinction.  Suspect that facial weakness is due to prior radiation therapy to the right side of the face.  He has been on Keppra  for seizure activity since January with unclear compliance.  LKW: 1030 Modified rankin score: 2-Slight disability-UNABLE to perform all activities but does not need assistance IV Thrombolysis: No, low suspicion for stroke EVT: No, exam not consistent with LVO  NIHSS components Score: Comment  1a Level of Conscious 0[x]  1[]  2[]  3[]      1b LOC Questions 0[]  1[x]  2[]       1c LOC Commands 0[x]  1[]  2[]       2 Best Gaze 0[x]  1[]  2[]       3 Visual 0[x]  1[]  2[]  3[]      4 Facial Palsy 0[x]  1[]  2[]  3[]      5a Motor Arm - left 0[]  1[x]  2[]  3[]  4[]  UN[]    5b Motor Arm - Right 0[]  1[x]  2[]  3[]  4[]  UN[]    6a Motor Leg - Left 0[]  1[x]  2[]  3[]  4[]  UN[]    6b Motor Leg - Right 0[]  1[x]  2[]  3[]  4[]  UN[]    7 Limb Ataxia 0[x]  1[]  2[]  UN[]      8 Sensory 0[]  1[x]  2[]  UN[]      9 Best Language 0[]  1[x]  2[]  3[]      10 Dysarthria 0[]  1[x]  2[]  UN[]      11 Extinct. and Inattention 0[]  1[x]  2[]       TOTAL:10       ROS   Unable to ascertain due to altered mental status/dementia  Past History   Past Medical  History:  Diagnosis Date   Diabetes mellitus type 2, diet-controlled (HCC)    GERD (gastroesophageal reflux disease)    Gout    H/O knee surgery    Hyperlipidemia    Oral cancer [facility])     Past Surgical History:  Procedure Laterality Date   COLONOSCOPY     COLONOSCOPY WITH PROPOFOL  N/A 03/21/2016   Procedure: COLONOSCOPY WITH PROPOFOL ;  Surgeon: Rogelia Copping, MD;  Location: [facility];  Service: Endoscopy;  Laterality: N/A;   ESOPHAGOGASTRODUODENOSCOPY (EGD) WITH PROPOFOL  N/A 03/21/2016   Procedure: ESOPHAGOGASTRODUODENOSCOPY (EGD) WITH PROPOFOL ;  Surgeon: Rogelia Copping, MD;  Location: [facility];  Service: Endoscopy;  Laterality: N/A;  diabetic - diet controlled   KNEE SURGERY     VASECTOMY      Family History: Family History  Problem Relation Age of Onset   Heart disease Mother    Arthritis Mother     Social History  reports that he has quit smoking. He has never used smokeless tobacco. He reports current alcohol use. He reports that he does not use drugs.  Allergies  Allergen Reactions   Tomato Hives  Medications   Current Facility-Administered Medications:    sodium chloride  flush (NS) 0.9 % injection 3 mL, 3 mL, Intravenous, Once, Scheving, Elsie CROME, MD  Current Outpatient Medications:    ARTIFICIAL SALIVA MT, Use as directed 1 application in the mouth or throat every 4 (four) hours as needed (dry mouth)., Disp: , Rfl:    atorvastatin  (LIPITOR) 80 MG tablet, Take 40 mg by mouth daily. , Disp: , Rfl:    Cholecalciferol  (VITAMIN D3) 1000 units CAPS, Take 2,000 Units by mouth daily. , Disp: , Rfl:    diclofenac  sodium (VOLTAREN ) 1 % GEL, Apply 4 g topically 3 (three) times daily as needed (pain)., Disp: , Rfl:    fludrocortisone  (FLORINEF ) 0.1 MG tablet, Take 0.1 mg by mouth daily., Disp: , Rfl:    latanoprost  (XALATAN ) 0.005 % ophthalmic solution, Place 1 drop into both eyes at bedtime., Disp: , Rfl:    levETIRAcetam  (KEPPRA ) 500 MG tablet, Take 1  tablet (500 mg total) by mouth 2 (two) times daily., Disp: 60 tablet, Rfl: 2   midodrine  (PROAMATINE ) 5 MG tablet, Take 10 mg by mouth 3 (three) times daily with meals., Disp: , Rfl:    nystatin  (MYCOSTATIN ) 100000 UNIT/ML suspension, Take 10 mLs by mouth 2 (two) times daily. Swish and swallow, Disp: , Rfl:    omeprazole (PRILOSEC) 20 MG capsule, Take 20 mg by mouth 2 (two) times daily before a meal., Disp: , Rfl:    oxyCODONE  (OXY IR/ROXICODONE ) 5 MG immediate release tablet, Take 5 mg by mouth every 6 (six) hours as needed for severe pain., Disp: , Rfl:    QUEtiapine  (SEROQUEL ) 100 MG tablet, Take 100 mg by mouth at bedtime. Take 100mg  at bedtime for 14 days. Then take 50mg  at bedtime for 7 days. Then stop., Disp: , Rfl:    sertraline  (ZOLOFT ) 100 MG tablet, Take 200 mg by mouth daily. , Disp: , Rfl:   Vitals   Vitals:   02-Jul-2024 1100 02-Jul-2024 1200  BP:  (!) 156/98  Pulse:  67  Resp:  18  Temp:  97.8 F (36.6 C)  TempSrc:  Axillary  SpO2:  100%  Weight: 72 kg     Body mass index is 20.94 kg/m.   Physical Exam   Constitutional: Well-developed, well-nourished elderly patient in no acute distress Psych: Affect flat Eyes: No scleral injection.  HENT: No OP obstruction.  Head: Normocephalic.  Respiratory: Effort normal, non-labored breathing.  Skin: WDI.   Neurologic Examination    NEURO:  Mental Status: Patient is alert and oriented to self, able to state correct age but not correct month Speech/Language: speech is with mild dysarthria and with paucity of speech  Cranial Nerves:  II: PERRL. Visual fields full.  III, IV, VI: EOMI. Eyelids elevate symmetrically.  V: Sensation is intact to light touch and questionably decreased on the right VII: Subtle right facial droop VIII: hearing intact to voice. IX, X: Voice is quiet volume and mildly dysarthric XII: tongue is midline without fasciculations. Motor: Able to move all 4 extremities with antigravity strength, but drift  bilaterally Tone: is normal and bulk is normal Sensation- Intact to light touch bilaterally.  Questionable right sided extinction, often says he is being touched on the left side regardless of where he is being touched Coordination: FTN intact bilaterally, HKS: no ataxia in BLE. Gait- deferred   Labs/Imaging/Neurodiagnostic studies   CBC:  Recent Labs  Lab 02-Jul-2024 1143 2024-07-02 1145  WBC 5.1  --   NEUTROABS 3.8  --  HGB 12.7* 12.9*  HCT 39.0 38.0*  MCV 96.5  --   PLT 117*  --    Basic Metabolic Panel:  Lab Results  Component Value Date   NA 142 06/02/2024   K 4.3 06/02/2024   CO2 24 07/01/2023   GLUCOSE 88 06/02/2024   BUN 26 (H) 06/02/2024   CREATININE 1.40 (H) 06/02/2024   CALCIUM  8.8 (L) 07/01/2023   GFRNONAA >60 07/01/2023   GFRAA >60 10/16/2016   Lipid Panel:  Lab Results  Component Value Date   LDLCALC 36 10/17/2016   HgbA1c:  Lab Results  Component Value Date   HGBA1C 5.2 10/17/2016   Urine Drug Screen:     Component Value Date/Time   LABOPIA NONE DETECTED 10/16/2016 2304   COCAINSCRNUR NONE DETECTED 10/16/2016 2304   LABBENZ NONE DETECTED 10/16/2016 2304   AMPHETMU NONE DETECTED 10/16/2016 2304   THCU NONE DETECTED 10/16/2016 2304   LABBARB NONE DETECTED 10/16/2016 2304    Alcohol Level     Component Value Date/Time   ETH <10 07/01/2023 0756   INR  Lab Results  Component Value Date   INR 1.1 06/02/2024   APTT  Lab Results  Component Value Date   APTT 29 06/02/2024    CT Head without contrast(Personally reviewed): Chronic ischemic white matter changes  CT angio Head and Neck with contrast(Personally reviewed): No LVO or hemodynamically significant stenosis  MRI Brain(Personally reviewed): Pending  Neurodiagnostics Continuous EEG:  Pending  ASSESSMENT   Conal Shetley is a 73 y.o. 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  On exam, he is able to follow simple and two-step commands, although he needs repeated prompting most of the time.  Right facial weakness is present, and there is drift in all 4 extremities.  There is questionable right-sided neglect, although this may be explained by confusion.  This presentation is similar to patient's presentation in January when he was treated for seizures and started on Keppra .  Will obtain Keppra  level and resume home dose of Keppra .  Will place on LTM EEG and obtain brain MRI.  TNK was not given as there is low suspicion that stroke is causing his symptoms, and thrombectomy was not performed due to exam not being consistent with LVO and no LVO seen on CT angiogram.  RECOMMENDATIONS  -LTM EEG - Keppra  level - Keppra  500 mg twice daily - Seizure precautions - MRI brain - Neurology will continue to follow ______________________________________________________________________  Patient seen by NP with MD, MD to edit note as needed.  Signed, Cortney E Everitt Clint Kill, NP [facility]   NEUROHOSPITALIST ADDENDUM Performed a face to face diagnostic evaluation.   I have reviewed the contents of history and physical exam as documented by PA/ARNP/Resident and agree with above documentation.  I have discussed and formulated the above plan as documented. Edits to the note have been made as needed.  Impression/Key exam findings/Plan: On exam, appears encephalopathic, possibly with some apathy. It makes it very difficult to get an accurate exam. CT Head negative, CTA with no LVO. He was taken to STAT MRI Brain and it was negative for stroke on DWI.  Plan is to put him up on LTM EEG. Appears to have had a similar episode  in Jan of this year and was started on Keppra  for starring off at that time. Will continue Keppra  today and do  LTM for atleast 24 hours.  Recommend medicine admission. We will follow.  Plan discussed with Dr. Francesca with the ED team.  Davelyn Gwinn, MD [facility] [phone]   If 7pm to 7am, please call on call as listed on AMION.

## 2024-06-02 NOTE — Clinical Note (Incomplete)
 Internal Medicine Attending:  I personally saw and examined the patient. Discussed with the resident and agree with the resident's findings and plan of care as documented in the resident's note.  22M PMH dementia, R parotid adenocarcinoma s/p resection 2017, XRT 2018 c/b R auditory canal osteoradionecrosis s/p canalplasty/tympanoplasty 05/2023 who presents s/p unwitnessed fall with presumed head strike originally brought into the ED as a code stroke (MRI negative), admitted for further workup of persistent encephalopathy.   VS: PE: NAD, slow to respond, A&Ox4, CV RRR, Pulm CTAB, Abd without TTP, no LE edema Labs: Cr 1.4 (at bl), WBC 5.1, Hgb 12.7 (at bl), plts 117  CTH (12/4): Stable mild for age cerebral white matter changes. ASPECTS 10.   CTA H/N (12/4): Minimal atherosclerosis without a large vessel occlusion or significant proximal stenosis in the head or neck.  MRI brain (12/4):  1. No acute infarct or acute intracranial abnormality when allowing for motion artifact. 2. 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  A/P: # #Chronic normocytic anemia, POA #Chronic thrombocytopenia, POA  -  EDD ***  Rest as below  Jone Dauphin MD

## 2024-06-02 NOTE — Plan of Care (Signed)

## 2024-06-02 NOTE — Code Documentation (Signed)
 Stroke Response Nurse Documentation Code Documentation  Ronnie Strickland is a 73 y.o. male arriving to [facility]  via [facility] on 06/02/2024 with past medical hx of diabetes, Oral Cancer, HLD. On No antithrombotic. Code stroke was activated by EMS.   Patient from home where he was LKW at 1030 and now complaining of altered mental status, slurred speech, and trouble speaking. Pt was at home with his family where he had a witnessed fall where he hit his head on a dresser. After the fall, family noted that patient was not responding well to them and seemed dazed. They called EMS. Upon arrival, EMS noted slurred speech and weakness.  Stroke team at the bedside on patient arrival. Labs drawn and patient cleared for CT by Dr. Francesca. Patient to CT with team. NIHSS 10, see documentation for details and code stroke times. Patient with disoriented, left facial droop, bilateral arm weakness, bilateral leg weakness, left decreased sensation, Expressive aphasia , dysarthria , and Sensory  neglect on exam. The following imaging was completed:  CT Head, CTA, and MRI. Patient is not a candidate for IV Thrombolytic due to stroke not suspected, MRI negative. Patient is not a candidate for IR due to no LVO on imaging per provider.   Care Plan: q2 NIHSS/VS, EEG.   Process Delays Noted: Inconsistent exam.   Bedside handoff with ED RN Topher.    Carolee Chiquita RAMAN  Stroke Response RN

## 2024-06-02 NOTE — Progress Notes (Addendum)
 Addressed page from nursing at 10 PM regarding patient wanting to get out of bed/dementia.  Nursing stated that she has had to sit bedside because he has been wanting to get out of bed and was not able to be redirected to bed for long; concern for fall if not constantly observed.  Bedside, patient endorsed back pain (from admission) and denied new pain, abdominal pain or dysuria.  Stated that he had not had a bowel movement in the last several days.  Patient was pleasant, alert, appeared comfortable, in no acute distress, able to answer questions appropriately (ROS) and with other questions stated that he had to leave, had to sleep, and earlier people were playing games with him.  Patient was hemodynamically stable.  Attempted redirection stating that he should try to get some sleep and patient was agreeable.  When walking away from bedside, patient lifted head and appeared like he was going to get out of bed.  Patient also had some concern with chest telemetry monitor, pointing it out to me and stating this is yours during examination.  Based on physical exam and history of dementia will order the following: -Trazodone  and Melatonin ordered (Patient had received trazodone  during previous hospitalizations and appeared to tolerate that well) -Delirium precautions ordered -Tele sitter ordered -Added senna in addition to previously ordered MiraLAX  -Consider stopping telemetry monitoring if remains stable.  -Monitor bowel movements and consider increasing bowel regimen.  Sallyanne Primas, DO [facility] Internal Medicine Residency - PGY1

## 2024-06-02 NOTE — Hospital Course (Addendum)
 Acting funny 73, hx dementia Was home with family, heard him fall, was using walker, hit head on dresser, called paramedics, not following commands, questionable R gaze deviation Presented [facility] ED code stroke Neuro does NOT think stroke with negative MRI Neuro wonders seizure? Plan 24 h EEG Similar episode last jan, was started Keppra , not sure if still taking this Fall does not appear to have caused trauma     Ronnie Strickland  Ronnie Strickland reports that all he remembers of today's events is waking up on his back on the floor at home. He does not recall the events preceding his fall. After waking up,   He has dementia at his baseline and a moderate level of confusion is typical for him  His family reports they were in the other room. They heard him fall and proceeded into the room after hearing the fall. They report that he has a history of dizziness and wonder if that caused his fall.  He shares that he has back pain and a posterior headache. He feels generally weak and tired and his family reports that his strength on standing is diminished over the past week. He denies nausea, vision changes, spinning sensation, and dyspnea. He reports some central chest pain that is not new, but present for a long time. He reported some tinnitus but this is an intermittent issue for him.  Family reports frequent falling usually with associated dizziness. The last fall was two weeks ago, but on average he falls weekly.  Recent medication changes? None.  Recently stopped sertraline  and quetiapine .    Dependent in ADLs/IADLs Lives at home with wife, his daughter, and his daughter's kids No smoking, alcohol, substances  Takes Atorvastatin  Hydralazine, but not today (takes in evening) Lisinopril Gabapentin (maybe 100) this am, half a trazodone , donepezil/memantine    He is immediately alert to self and place. With additional thought he is alert to time.

## 2024-06-03 ENCOUNTER — Observation Stay (HOSPITAL_COMMUNITY)

## 2024-06-03 DIAGNOSIS — G928 Other toxic encephalopathy: Secondary | ICD-10-CM | POA: Diagnosis present

## 2024-06-03 DIAGNOSIS — D631 Anemia in chronic kidney disease: Secondary | ICD-10-CM | POA: Diagnosis present

## 2024-06-03 DIAGNOSIS — F0390 Unspecified dementia without behavioral disturbance: Secondary | ICD-10-CM | POA: Diagnosis not present

## 2024-06-03 DIAGNOSIS — E785 Hyperlipidemia, unspecified: Secondary | ICD-10-CM | POA: Diagnosis present

## 2024-06-03 DIAGNOSIS — R2990 Unspecified symptoms and signs involving the nervous system: Secondary | ICD-10-CM | POA: Diagnosis present

## 2024-06-03 DIAGNOSIS — F0280 Dementia in other diseases classified elsewhere without behavioral disturbance: Secondary | ICD-10-CM | POA: Diagnosis present

## 2024-06-03 DIAGNOSIS — K219 Gastro-esophageal reflux disease without esophagitis: Secondary | ICD-10-CM | POA: Diagnosis present

## 2024-06-03 DIAGNOSIS — D649 Anemia, unspecified: Secondary | ICD-10-CM | POA: Diagnosis not present

## 2024-06-03 DIAGNOSIS — Z85828 Personal history of other malignant neoplasm of skin: Secondary | ICD-10-CM | POA: Diagnosis not present

## 2024-06-03 DIAGNOSIS — I129 Hypertensive chronic kidney disease with stage 1 through stage 4 chronic kidney disease, or unspecified chronic kidney disease: Secondary | ICD-10-CM | POA: Diagnosis present

## 2024-06-03 DIAGNOSIS — R55 Syncope and collapse: Secondary | ICD-10-CM | POA: Diagnosis not present

## 2024-06-03 DIAGNOSIS — R4701 Aphasia: Secondary | ICD-10-CM | POA: Diagnosis present

## 2024-06-03 DIAGNOSIS — N179 Acute kidney failure, unspecified: Secondary | ICD-10-CM | POA: Diagnosis present

## 2024-06-03 DIAGNOSIS — Z79899 Other long term (current) drug therapy: Secondary | ICD-10-CM | POA: Diagnosis not present

## 2024-06-03 DIAGNOSIS — G9340 Encephalopathy, unspecified: Secondary | ICD-10-CM | POA: Diagnosis not present

## 2024-06-03 DIAGNOSIS — E1140 Type 2 diabetes mellitus with diabetic neuropathy, unspecified: Secondary | ICD-10-CM | POA: Diagnosis present

## 2024-06-03 DIAGNOSIS — R569 Unspecified convulsions: Secondary | ICD-10-CM | POA: Diagnosis present

## 2024-06-03 DIAGNOSIS — W19XXXA Unspecified fall, initial encounter: Secondary | ICD-10-CM | POA: Diagnosis not present

## 2024-06-03 DIAGNOSIS — E1122 Type 2 diabetes mellitus with diabetic chronic kidney disease: Secondary | ICD-10-CM | POA: Diagnosis present

## 2024-06-03 DIAGNOSIS — R1310 Dysphagia, unspecified: Secondary | ICD-10-CM | POA: Diagnosis present

## 2024-06-03 DIAGNOSIS — Z8249 Family history of ischemic heart disease and other diseases of the circulatory system: Secondary | ICD-10-CM | POA: Diagnosis not present

## 2024-06-03 DIAGNOSIS — G309 Alzheimer's disease, unspecified: Secondary | ICD-10-CM | POA: Diagnosis present

## 2024-06-03 DIAGNOSIS — Z66 Do not resuscitate: Secondary | ICD-10-CM | POA: Diagnosis present

## 2024-06-03 DIAGNOSIS — W1830XA Fall on same level, unspecified, initial encounter: Secondary | ICD-10-CM | POA: Diagnosis present

## 2024-06-03 DIAGNOSIS — R296 Repeated falls: Secondary | ICD-10-CM | POA: Diagnosis present

## 2024-06-03 DIAGNOSIS — I951 Orthostatic hypotension: Secondary | ICD-10-CM | POA: Diagnosis present

## 2024-06-03 DIAGNOSIS — M8738 Other secondary osteonecrosis, other site: Secondary | ICD-10-CM | POA: Diagnosis present

## 2024-06-03 DIAGNOSIS — Y92009 Unspecified place in unspecified non-institutional (private) residence as the place of occurrence of the external cause: Secondary | ICD-10-CM | POA: Diagnosis not present

## 2024-06-03 DIAGNOSIS — N182 Chronic kidney disease, stage 2 (mild): Secondary | ICD-10-CM | POA: Diagnosis present

## 2024-06-03 DIAGNOSIS — F02818 Dementia in other diseases classified elsewhere, unspecified severity, with other behavioral disturbance: Secondary | ICD-10-CM | POA: Diagnosis not present

## 2024-06-03 DIAGNOSIS — D696 Thrombocytopenia, unspecified: Secondary | ICD-10-CM | POA: Diagnosis present

## 2024-06-03 DIAGNOSIS — Z87891 Personal history of nicotine dependence: Secondary | ICD-10-CM | POA: Diagnosis not present

## 2024-06-03 DIAGNOSIS — T43215A Adverse effect of selective serotonin and norepinephrine reuptake inhibitors, initial encounter: Secondary | ICD-10-CM | POA: Diagnosis present

## 2024-06-03 LAB — CBC
HCT: 32.9 % — ABNORMAL LOW (ref 39.0–52.0)
Hemoglobin: 11.2 g/dL — ABNORMAL LOW (ref 13.0–17.0)
MCH: 32.3 pg (ref 26.0–34.0)
MCHC: 34 g/dL (ref 30.0–36.0)
MCV: 94.8 fL (ref 80.0–100.0)
Platelets: 114 K/uL — ABNORMAL LOW (ref 150–400)
RBC: 3.47 MIL/uL — ABNORMAL LOW (ref 4.22–5.81)
RDW: 12.9 % (ref 11.5–15.5)
WBC: 5 K/uL (ref 4.0–10.5)
nRBC: 0 % (ref 0.0–0.2)

## 2024-06-03 LAB — BASIC METABOLIC PANEL WITH GFR
Anion gap: 3 — ABNORMAL LOW (ref 5–15)
BUN: 19 mg/dL (ref 8–23)
CO2: 31 mmol/L (ref 22–32)
Calcium: 8.5 mg/dL — ABNORMAL LOW (ref 8.9–10.3)
Chloride: 105 mmol/L (ref 98–111)
Creatinine, Ser: 1.17 mg/dL (ref 0.61–1.24)
GFR, Estimated: >60 mL/min (ref >60)
Glucose, Bld: 73 mg/dL (ref 70–99)
Potassium: 3.7 mmol/L (ref 3.5–5.1)
Sodium: 139 mmol/L (ref 135–145)

## 2024-06-03 LAB — PROLACTIN: Prolactin: 10.8 ng/mL (ref 3.6–25.2)

## 2024-06-03 MED ORDER — MEMANTINE HCL 10 MG PO TABS
10.0000 mg | ORAL_TABLET | Freq: Two times a day (BID) | ORAL | Status: DC
  Administered 2024-06-03 – 2024-06-04 (×3): 10 mg via ORAL
  Filled 2024-06-03 (×3): qty 1

## 2024-06-03 NOTE — Plan of Care (Signed)
   Problem: Education: Goal: Knowledge of General Education information will improve Description: Including pain rating scale, medication(s)/side effects and non-pharmacologic comfort measures Outcome: Progressing   Problem: Clinical Measurements: Goal: Ability to maintain clinical measurements within normal limits will improve Outcome: Progressing Goal: Will remain free from infection Outcome: Progressing   Problem: Activity: Goal: Risk for activity intolerance will decrease Outcome: Progressing   Problem: Nutrition: Goal: Adequate nutrition will be maintained Outcome: Progressing   Problem: Pain Managment: Goal: General experience of comfort will improve and/or be controlled Outcome: Progressing   Problem: Safety: Goal: Ability to remain free from injury will improve Outcome: Progressing

## 2024-06-03 NOTE — Progress Notes (Signed)
 NEUROLOGY CONSULT FOLLOW UP NOTE   Date of service: June 03, 2024 Patient Name: Ronnie Strickland MRN:  [id number] DOB:  1951-06-10  Interval Hx/subjective   Symptoms hve resolved. He is close to his baseline. Unfortunately, unable to hook him up to LTM last night. He is being hooked up to LTM EEG.  Vitals   Vitals:   06/02/24 2045 06/03/24 0009 06/03/24 0550 06/03/24 1228  BP: (!) 131/97 118/81 134/88 110/79  Pulse: 61 (!) 59 (!) 59 69  Resp:  18 18 16   Temp: 97.8 F (36.6 C) 97.6 F (36.4 C) (!) 97.5 F (36.4 C) 98.5 F (36.9 C)  TempSrc: Oral Oral Oral Oral  SpO2: 100% 99% 100% 99%  Weight:      Height:         Body mass index is 19.63 kg/m.  Physical Exam   General: Laying comfortably in bed; in no acute distress.  HENT: Normal oropharynx and mucosa. Normal external appearance of ears and nose.  Neck: Supple, no pain or tenderness  CV: No JVD. No peripheral edema.  Pulmonary: Symmetric Chest rise. Normal respiratory effort.  Abdomen: Soft to touch, non-tender.  Ext: No cyanosis, edema, or deformity  Skin: No rash. Normal palpation of skin.   Musculoskeletal: Normal digits and nails by inspection. No clubbing.   Neurologic Examination  Mental status/Cognition: Alert, bradyphernia, oriented to self, place, month and year, good attention.  Speech/language: Fluent, comprehension intact, object naming intact, repetition intact. Cranial nerves:   CN II Pupils equal and reactive to light, no VF deficits    CN III,IV,VI EOM intact, no gaze preference or deviation, no nystagmus    CN V normal sensation in V1, V2, and V3 segments bilaterally    CN VII no asymmetry, no nasolabial fold flattening    CN VIII normal hearing to speech    CN IX & X normal palatal elevation, no uvular deviation    CN XI 5/5 head turn and 5/5 shoulder shrug bilaterally    CN XII midline tongue protrusion    Motor:  Muscle bulk: normal, tone normal, pronator drift none tremor none Mvmt Root  Nerve  Muscle Right Left Comments  SA C5/6 Ax Deltoid     EF C5/6 Mc Biceps 5 5   EE C6/7/8 Rad Triceps 5 5   WF C6/7 Med FCR     WE C7/8 PIN ECU     F Ab C8/T1 U ADM/FDI 5 5   HF L1/2/3 Fem Illopsoas 5 5   KE L2/3/4 Fem Quad     DF L4/5 D Peron Tib Ant 5 5   PF S1/2 Tibial Grc/Sol 5 5    Sensation:  Light touch Intact throughout   Pin prick    Temperature    Vibration   Proprioception    Coordination/Complex Motor:  - Finger to Nose intact BL - Gait: deferred  Medications  Current Facility-Administered Medications:    acetaminophen  (TYLENOL ) tablet 650 mg, 650 mg, Oral, Q6H PRN, 650 mg at 06/03/24 1324 **OR** acetaminophen  (TYLENOL ) suppository 650 mg, 650 mg, Rectal, Q6H PRN, Juberg, Christopher, DO   atorvastatin  (LIPITOR) tablet 40 mg, 40 mg, Oral, Daily, Juberg, Christopher, DO, 40 mg at 06/03/24 0944   cholecalciferol  (VITAMIN D3) 25 MCG (1000 UNIT) tablet 2,000 Units, 2,000 Units, Oral, Daily, Juberg, Christopher, DO, 2,000 Units at 06/03/24 0944   enoxaparin  (LOVENOX ) injection 40 mg, 40 mg, Subcutaneous, Q24H, Juberg, Christopher, DO, 40 mg at 06/03/24 1418   levETIRAcetam  (KEPPRA ) undiluted  injection 500 mg, 500 mg, Intravenous, Q12H, de [place], [place] E, NP, 500 mg at 06/03/24 1202   melatonin tablet 3 mg, 3 mg, Oral, QHS, Jolaine Pac, DO, 3 mg at 06/02/24 2214   memantine  (NAMENDA ) tablet 10 mg, 10 mg, Oral, BID, Juberg, Christopher, DO, 10 mg at 06/03/24 0944   pantoprazole  (PROTONIX ) EC tablet 40 mg, 40 mg, Oral, Daily, Juberg, Christopher, DO, 40 mg at 06/03/24 0944   polyethylene glycol (MIRALAX  / GLYCOLAX ) packet 17 g, 17 g, Oral, Daily, Juberg, Christopher, DO, 17 g at 06/03/24 0945   senna-docusate (Senokot-S) tablet 1 tablet, 1 tablet, Oral, QHS, Rihner, Emilie, DO, 1 tablet at 06/03/24 0040   traZODone  (DESYREL ) tablet 50 mg, 50 mg, Oral, QHS PRN, Jolaine Pac, DO, 50 mg at 06/02/24 2214  Labs and Diagnostic Imaging   CBC:  Recent Labs  Lab  06/02/24 1143 06/02/24 1145 06/03/24 0539  WBC 5.1  --  5.0  NEUTROABS 3.8  --   --   HGB 12.7* 12.9* 11.2*  HCT 39.0 38.0* 32.9*  MCV 96.5  --  94.8  PLT 117*  --  114*    Basic Metabolic Panel:  Lab Results  Component Value Date   NA 139 06/03/2024   K 3.7 06/03/2024   CO2 31 06/03/2024   GLUCOSE 73 06/03/2024   BUN 19 06/03/2024   CREATININE 1.17 06/03/2024   CALCIUM  8.5 (L) 06/03/2024   GFRNONAA >60 06/03/2024   GFRAA >60 10/16/2016   Lipid Panel:  Lab Results  Component Value Date   LDLCALC 36 10/17/2016   HgbA1c:  Lab Results  Component Value Date   HGBA1C 5.2 10/17/2016   Urine Drug Screen:     Component Value Date/Time   LABOPIA NONE DETECTED 10/16/2016 2304   COCAINSCRNUR NONE DETECTED 10/16/2016 2304   LABBENZ NONE DETECTED 10/16/2016 2304   AMPHETMU NONE DETECTED 10/16/2016 2304   THCU NONE DETECTED 10/16/2016 2304   LABBARB NONE DETECTED 10/16/2016 2304    Alcohol Level     Component Value Date/Time   ETH <15 06/02/2024 1143   INR  Lab Results  Component Value Date   INR 1.1 06/02/2024   APTT  Lab Results  Component Value Date   APTT 29 06/02/2024   AED levels: No results found for: PHENYTOIN, ZONISAMIDE, LAMOTRIGINE, LEVETIRACETA  CT Head without contrast(Personally reviewed): CTH was negative for a large hypodensity concerning for a large territory infarct or hyperdensity concerning for an ICH  CT angio Head and Neck with contrast(Personally reviewed): No LVO  MRI Brain(Personally reviewed): 1. No acute infarct or acute intracranial abnormality when allowing for motion artifact. 2. 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  cEEG:  pending  Assessment  Ronnie Strickland is a 73 y.o. 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  On exam today, he is back to baseline with no focal deficits. Yesterday,, felt to have ?Rfacial weakness and R sided neglect.  Workup so far with MRI negative. Had similar episode in [place] of this year and started on Keppra  and compliant with Keppra  levels pending.  Will put him up on LTM EEG.  Recommendations  - LTM EEG overnight. -  continue Keppra  500 BID. ______________________________________________________________________  Plan dicussed with patient and his grand daughter at the bedside.  Signed, Marvelene Stoneberg, MD [facility]

## 2024-06-03 NOTE — Progress Notes (Signed)
 LTM VIDEO EEG hooked up and running - no initial skin breakdown - push button tested - [facility] is monitoring.

## 2024-06-03 NOTE — Progress Notes (Signed)
 HD#0 SUBJECTIVE:  Patient Summary: Ronnie Strickland is a 73 y.o. 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   Overnight Events: Agitated and confused, trying to get out of bed. Received home trazodone  and melatonin. Tele sitter was ordered. See Dr. Saint note for further details.  Interim History: Patient reports he is alright this morning.  Family at bedside thinks that he is somewhat disoriented but perking up and expecting breakfast to help him to.  Ronnie Strickland otherwise does not provide much history.  He did report to the nurse that he felt woozy earlier this morning.  His family did provide additional history that he was started on Keppra  at previous hospitalization in January 2025 at [facility] but the patient's spouse did not fill the prescription and he has been off this medication since then.   OBJECTIVE:  Vital Signs: Vitals:   06/02/24 2045 06/03/24 0009 06/03/24 0550 06/03/24 1228  BP: (!) 131/97 118/81 134/88 110/79  Pulse: 61 (!) 59 (!) 59 69  Resp:  18 18 16   Temp: 97.8 F (36.6 C) 97.6 F (36.4 C) (!) 97.5 F (36.4 C) 98.5 F (36.9 C)  TempSrc: Oral Oral Oral Oral  SpO2: 100% 99% 100% 99%  Weight:      Height:        Filed Weights   06/02/24 1100 06/02/24 1754  Weight: 72 kg 67.5 kg     Intake/Output Summary (Last 24 hours) at 06/03/2024 1331 Last data filed at 06/02/2024 1822 Gross per 24 hour  Intake 34.78 ml  Output 300 ml  Net -265.22 ml   Net IO Since Admission: -265.22 mL [06/03/24 1331]  Physical Exam: Constitutional: Well-appearing elderly man in no acute distress HENT: normocephalic atraumatic, mucous membranes moist Eyes: conjunctiva non-erythematous, PERRL, no scleral icterus Cardiovascular: regular rate  and rhythm, no m/r/g Pulmonary/Chest: normal work of breathing on room air, lungs clear to auscultation bilaterally Abdominal: soft, non-tender, non-distended, bowel sounds normal Neurological: alert but oriented to person only; answers questions appropriately. Strength 5/5 in upper and lower extremities. Skin: warm and dry; scattered bruising on upper extremities consistent with admission exam Extremities: no edema or cyanosis; peripheral pulses intact Psych: Difficult to assess thought content; mood congruent with affect.  Patient Lines/Drains/Airways Status     Active Line/Drains/Airways     Name Placement date Placement time Site Days   Peripheral IV 06/02/24 18 G Anterior;Left;Proximal Forearm 06/02/24  1045  Forearm  1            Pertinent labs and imaging:     Latest Ref Rng & Units 06/03/2024    5:39 AM 06/02/2024   11:45 AM 06/02/2024   11:43 AM  CBC  WBC 4.0 - 10.5 K/uL 5.0   5.1   Hemoglobin 13.0 - 17.0 g/dL 88.7  87.0  87.2   Hematocrit 39.0 - 52.0 % 32.9  38.0  39.0   Platelets 150 - 400 K/uL 114   117        Latest Ref Rng & Units 06/03/2024    5:39 AM 06/02/2024   11:45 AM 06/02/2024   11:43 AM  CMP  Glucose 70 - 99 mg/dL 73  88  93   BUN 8 - 23 mg/dL 19  26  22    Creatinine 0.61 - 1.24 mg/dL 8.82  1.40  1.40   Sodium 135 - 145 mmol/L 139  142  141   Potassium 3.5 - 5.1 mmol/L 3.7  4.3  4.3   Chloride 98 - 111 mmol/L 105  103  107   CO2 22 - 32 mmol/L 31   29   Calcium  8.9 - 10.3 mg/dL 8.5   8.8   Total Protein 6.5 - 8.1 g/dL   6.4   Total Bilirubin 0.0 - 1.2 mg/dL   0.9   Alkaline Phos 38 - 126 U/L   73   AST 15 - 41 U/L   24   ALT 0 - 44 U/L   18     No results found.   ASSESSMENT/PLAN:  Assessment: Principal Problem:   Stroke-like symptoms   Ronnie Strickland is a 73 y.o. 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  Plan: #Encephalopathy #?Seizure Reported history of seizure-like activity during previous evaluation on 07/01/23 and was started on Keppra  by Neurology at that time. Non-adherence given patient's history of dementia and family unfamiliar with medication list. Upon admission there was concern for stroke given subtle right facial droop and questionable extinction. MRI brain negative for acute infarct. Strength 5/5 today. He received Keppra  in the ED. Neurology is following patient and recommending EEG. Cannot definitively rule out seizure-like activity prior to admission but this would likely be in the setting of medication non-adherence. Encephalopathy could be in setting of centrally-acting medications, can consider adjusting medications in the outpatient setting.  - Neurology consulted, appreciate recs:  - LTM EEG  - IV Keppra  500 mg bid  - Seizure precautions  #Orthostasis #Frequent falls Patient endorses chronic dizziness, with almost weekly falls per family. Did come in after unwitnessed fall.  Previously was on midodrine  and fludrocortisone  but has been hypertensive during this admission. Orthostatic during this admission, likely multifactorial in the setting of poor PO intake and polypharmacy. Did improve with IVF bolus. Plan to repeat orthostatic vitals. May benefit from compression stockings and abdominal binder in the outpatient setting. Several medications may be contributing to his fall risk including gabapentin and trazodone . Could consider switching gabapentin to duloxetine. - Repeat orthostatic vitals  #?AKI on CKD Prior history of CKD with baseline Cr of 1.1-1.2. Initial Cr of 1.40 but downtrended to 1.17 after IVF. Likely prerenal due to poor PO intake. - Trend BMPs - Encourage fluids  #Dementia #High risk of hospital delirium MRI with progression of  chronic small vessel disease in the left internal capsule and pons compared to MRI last year. Patient initially alert and oriented to name, place, year - and disoriented last night before receiving trazodone  and melatonin. Likely in the setting of hospital delirium. Should improve upon discharge. - Delirium precautions - Continue home memantine  10 mg bid - Melatonin 3 mg at bedtime - Bowel regimen: Senokot-S and Miralax /Glycolax  daily  #Anemia Hgb downtrending with a normal MCV. He did receive fluids which could indicate hemodilution. No evidence of bleeding.  #Chronic Thrombocytopenia Platelets of 117 which is stable. Past values in the 100s. No bleeding concerns.   #History of parotid adenocarcinoma s/p resection 2017, XRT 2018 May be contributing to baseline facial droop. Does have baseline swallowing difficulties. SLP eval showing moderate aspiration risk but family declining further workup with MBS. Will place  on regular diet and continue to monitor.   #Goals of Care Upon admission, patient's spouse states that she would like him to be DNR/DNI which is consistent with the patient's wishes. Agree this is reasonable given the patient's worsening dementia and other medical comorbidities.  Chronic Stable Medical Conditions  #GERD - pantoprazole  40 mg daily #HLD - continue atorvastatin  40 mg daily   Best Practice: Diet: Regular IVF: Fluids: none VTE: enoxaparin  (LOVENOX ) injection 40 mg Start: 06/02/24 1500 Code: DNR/DNI  Disposition planning: Therapy Recs: Home Health, DME: bedside commode Family Contact: Granddaughter and her partner at bedside. DISPO: Anticipated discharge in 1-2 days to Home pending further workup.  Signature:  Letha Cheadle, MD [facility]  PGY-1 06/03/2024, 1:31 PM  On Call pager [phone]

## 2024-06-03 NOTE — Evaluation (Signed)
 Occupational Therapy Evaluation Patient Details Name: Ronnie Strickland MRN: [id number] DOB: [birth year] Today's Date: 06/03/2024   History of Present Illness   Ronnie Strickland is a 73 y.o. male who [facility]/02/24 as a CODE STROKE. Pt had a fall at home and struck his head against a dresser. Presented with  weakness, facial droop, and trouble speaking. CT Head negative, CTA with no LVO, and MRI Brain negative. 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     Clinical Impressions Pt seen for OT eval this PM. Pt disoriented to time, kept repeating June and 2005, but was able to state correct year with logical cueing. Hx dementia per chart. Per PT eval from 12/4, pt was needing assist with BADLs and with ambulatory short distances with RW. Today, he presents with limitations in cognition, balance, and hemodynamic response to activity - limited by orthostatics - see below. CGA overall for bed mobility, setup for UB ADLs and up to mod A for LB ADLs (simulation).   Pt is currently functioning below baseline and would benefit from ongoing acute OT services to progress towards safe discharge and to facilitate return to prior level of function. Current recommendation is home with home health OT.    06/03/24 1659  Orthostatic Lying   BP- Lying (!) 75/56  Orthostatic Sitting  BP- Sitting (!) 82/51  Orthostatic Standing at 0 minutes  BP- Standing at 0 minutes  (unable to tolerate standing due to hypotensive sitting BP)       If plan is discharge home, recommend the following:   A little help with walking and/or transfers;A lot of help with bathing/dressing/bathroom;Assistance with cooking/housework;Direct supervision/assist for medications management;Direct supervision/assist for financial management;Assist for transportation;Help with stairs or ramp for entrance;Supervision due to cognitive  status     Functional Status Assessment   Patient has had a recent decline in their functional status and demonstrates the ability to make significant improvements in function in a reasonable and predictable amount of time.     Equipment Recommendations   None recommended by OT (pt has adequate DME)     Recommendations for Other Services         Precautions/Restrictions   Precautions Precautions: Fall Recall of Precautions/Restrictions: Impaired Precaution/Restrictions Comments: + orthostatics Restrictions Weight Bearing Restrictions Per Provider Order: No     Mobility Bed Mobility Overal bed mobility: Needs Assistance Bed Mobility: Supine to Sit, Sit to Supine     Supine to sit: Contact guard, HOB elevated Sit to supine: Contact guard assist, HOB elevated   General bed mobility comments: exited to the R side, HOB elevated    Transfers                   General transfer comment: deferred 2/2 persistent hypotension      Balance Overall balance assessment: Needs assistance, History of Falls Sitting-balance support: Bilateral upper extremity supported, Feet supported Sitting balance-Leahy Scale: Fair Sitting balance - Comments: seated EOB, occasionally reaching for bed rail for stability                                   ADL either performed or assessed with clinical judgement   ADL Overall ADL's : Needs assistance/impaired Eating/Feeding: Set up;Bed level   Grooming: Set up;Bed level   Upper Body Bathing: Set up  Lower Body Bathing: Moderate assistance   Upper Body Dressing : Set up   Lower Body Dressing: Maximal assistance       Toileting- Clothing Manipulation and Hygiene: Maximal assistance               Vision   Vision Assessment?:  (no eye glasses present at time of OT eval, pt denies visual changes)     Perception         Praxis         Pertinent Vitals/Pain Pain Assessment Pain Assessment:  No/denies pain Faces Pain Scale: No hurt     Extremity/Trunk Assessment Upper Extremity Assessment Upper Extremity Assessment: Overall WFL for tasks assessed   Lower Extremity Assessment Lower Extremity Assessment: Defer to PT evaluation       Communication Communication Communication: Impaired Factors Affecting Communication: Difficulty expressing self;Reduced clarity of speech (hypophonic)   Cognition Arousal: Alert Behavior During Therapy: Flat affect Cognition: History of cognitive impairments (baseline cog impairments)             OT - Cognition Comments: dementia per chart                 Following commands: Impaired Following commands impaired: Follows one step commands inconsistently, Follows one step commands with increased time     Cueing  General Comments   Cueing Techniques: Verbal cues;Gestural cues;Tactile cues;Visual cues  BP supine pre-mobility: 78/56 (63), BP seated EOB: 82/51 (58), BP upon return to supine: 94/57 (69); pt asymptomatic, RN notified and aware   Exercises     Shoulder Instructions      Home Living Family/patient expects to be discharged to:: Private residence Living Arrangements: Spouse/significant other;Other relatives (grandchildren (4,6,8 y/o)) Available Help at Discharge: Family;Available 24 hours/day Type of Home: House Home Access: Stairs to enter;Ramped entrance Entrance Stairs-Number of Steps: 2 Entrance Stairs-Rails: None Home Layout: Two level;Able to live on main level with bedroom/bathroom     Bathroom Shower/Tub: Producer, Television/film/video: Standard     Home Equipment: Shower seat;Cane - single point;Rollator (4 wheels);Wheelchair - manual;Grab bars - tub/shower;Grab bars - toilet;Hand held shower head;Other (comment)          Prior Functioning/Environment Prior Level of Function : Needs assist  Cognitive Assist : Mobility (cognitive);ADLs (cognitive) Mobility (Cognitive): Step by step  cues ADLs (Cognitive): Step by step cues Physical Assist : Mobility (physical);ADLs (physical) Mobility (physical): Transfers;Gait;Stairs ADLs (physical): Bathing;Dressing;Toileting;IADLs Mobility Comments: Spouse reports pt can sit up on the EOB. He always has 1+ assistance whenever he is up and moving using the rollator. Uses the w/c whenever he goes into the community and/or there is an onset of dizziness. Multiple falls in the past 24mo. Spouse reports an OT from the [place] informed her to use the w/c all the time. ADLs Comments: Spouse reports she helps him with all self-care. Pt will get into the shower to bath with assist while sitting in the shower chair and with wife present the whole time. Pt is able to feed himself and brush his teeth. Spouse manages medications and all other IADLs including transport.    OT Problem List: Decreased activity tolerance;Impaired balance (sitting and/or standing);Decreased cognition;Decreased safety awareness   OT Treatment/Interventions: Self-care/ADL training;Therapeutic exercise;Therapeutic activities;Cognitive remediation/compensation;Balance training;Patient/family education      OT Goals(Current goals can be found in the care plan section)   Acute Rehab OT Goals Patient Stated Goal: did not state OT Goal Formulation: With patient Time For Goal Achievement: 06/17/24 Potential  to Achieve Goals: Fair   OT Frequency:  Min 2X/week    Co-evaluation              AM-PAC OT 6 Clicks Daily Activity     Outcome Measure Help from another person eating meals?: None Help from another person taking care of personal grooming?: A Little Help from another person toileting, which includes using toliet, bedpan, or urinal?: A Lot Help from another person bathing (including washing, rinsing, drying)?: A Lot Help from another person to put on and taking off regular upper body clothing?: A Little Help from another person to put on and taking off regular  lower body clothing?: A Lot 6 Click Score: 16   End of Session Nurse Communication: Mobility status;Other (comment) (+ orthostatics)  Activity Tolerance: Patient tolerated treatment well;Other (comment) (limited by OHBP) Patient left: in bed;with call bell/phone within reach;with bed alarm set  OT Visit Diagnosis: Unsteadiness on feet (R26.81);Other abnormalities of gait and mobility (R26.89);Repeated falls (R29.6);Other symptoms and signs involving cognitive function                Time: 8572-8554 OT Time Calculation (min): 18 min Charges:  OT General Charges $OT Visit: 1 Visit OT Evaluation $OT Eval Low Complexity: 1 Low  Shayan Bramhall M. [place], OTR/L [facility] Acute Rehabilitation Services [phone] Secure Chat Preferred  Briston Lax 06/03/2024, 4:57 PM

## 2024-06-03 NOTE — Progress Notes (Signed)
 PT Cancellation Note  Patient Details Name: Ronnie Strickland MRN: [id number] DOB: 02-07-1951   Cancelled Treatment:    Reason Eval/Treat Not Completed: Patient at procedure or test/unavailable (Pt getting placed on EEG. Will follow up later if time allows.)   Satoshi Kalas 06/03/2024, 12:15 PM

## 2024-06-03 NOTE — Discharge Summary (Cosign Needed Addendum)
 Name: Ronnie Strickland MRN: [id number] DOB: Mar 21, 1951 73 y.o. PCP: [facility]  Date of Admission: 06/02/2024 11:42 AM Date of Discharge: 06/04/2024  Attending Physician: Dr. Dayton Eastern  Discharge Diagnosis: Principal Problem:   Stroke-like symptoms Encephalopathy ?Seizure Unwitnessed fall Dementia Anemia Thrombocytopenia History of parotid adenocarcinoma s/p resection, XRT  Discharge Medications: Allergies as of 06/04/2024       Reactions   Tomato Hives        Medication List     STOP taking these medications    sertraline  100 MG tablet Commonly known as: ZOLOFT        TAKE these medications    allopurinol 100 MG tablet Commonly known as: ZYLOPRIM Take 100 mg by mouth every evening.   atorvastatin  80 MG tablet Commonly known as: LIPITOR Take 40 mg by mouth daily.   Brinzolamide-Brimonidine 1-0.2 % Susp Place 1 drop into the left eye in the morning and at bedtime.   gabapentin 300 MG capsule Commonly known as: NEURONTIN Take 300 mg by mouth in the morning.   latanoprost  0.005 % ophthalmic solution Commonly known as: XALATAN  Place 1 drop into both eyes at bedtime.   levETIRAcetam  500 MG tablet Commonly known as: Keppra  Take 1 tablet (500 mg total) by mouth 2 (two) times daily.   melatonin 3 MG Tabs tablet Take 6 mg by mouth at bedtime.   memantine  10 MG tablet Commonly known as: NAMENDA  Take 10 mg by mouth 2 (two) times daily.   midodrine  2.5 MG tablet Commonly known as: PROAMATINE  Take 2.5 mg by mouth 2 (two) times daily with a meal.   omeprazole 20 MG capsule Commonly known as: PRILOSEC Take 20 mg by mouth 2 (two) times daily before a meal.   traZODone  50 MG tablet Commonly known as: DESYREL  Take 25 mg by mouth.   Vitamin D3 25 MCG (1000 UT) Caps Take 2,000 Units by mouth daily.        Disposition and follow-up:   Ronnie.Breanna Olmo was discharged from [facility] in Good condition.  At the hospital  follow up visit please address:  1.  Follow-up:   a. Encephalopathy/Seizures - Presented with aphasia unclear if related to acute event or his dementia. Returned to baseline by time of admission. Upon workup, he did not suffer a stroke and no seizure activity was demonstrated in hospital, but he has a history of seizure early 2025. Please ensure the patient adheres to his Keppra .     b. Recurrent Falls/Orthostasis - Will maintain his midodrine  but blood pressure monitoring will be important. Pressures were high at times in the hospital. Orthostatics positive in hospital, 150 systolic reclining but 110 with standing. He may benefit from an abdominal binder and compression stockings. Consider switching gabapentin to duloxetine for neuropathy. Consider switching trazodone  to low dose Seroquel  at nighttime. Home health PT and OT are ordered.  2.  Labs / imaging needed at time of follow-up: none  3.  Pending labs/ test needing follow-up: none  Follow-up Appointments: With primary care physician [facility] Course by problem list: #Encephalopathy #?Seizure Presented to hospital with unwitnessed fall in the setting of recurrent falls and history of seizure-like activity during previous evaluation on 07/01/23 and was started on Keppra  by Neurology at that time. Has not been taking Keppra  due to confusion by family members who administer medications. Thought to have hit head upon falling this time but CT head negative for acute bleed. Upon admission there was concern  for stroke given subtle right facial droop and questionable extinction. MRI brain negative for acute infarct. CTA head + neck negative. Strength 5/5. He received IV Keppra  during his stay. Neurology recommended LTM EEG which showed no epileptiform activity. Prior seizure not definitively ruled out.  Encephalopathy could be in setting of centrally-acting medications such as trazodone  and gabapentin which can be adjusted in the outpatient  setting.   #Orthostasis #Frequent falls Patient endorses chronic dizziness, with almost weekly falls per family. Did come in after unwitnessed fall. Previously was on midodrine  and fludrocortisone  but held during admission due to hypertension. Orthostatic during this admission, likely multifactorial in the setting of poor PO intake and polypharmacy. Did improve with IVF bolus.  May benefit from compression stockings and abdominal binder in the outpatient setting. Several medications may be contributing to his fall risk including gabapentin and trazodone . Could consider switching gabapentin to duloxetine. Will order home health PT and OT.    #?AKI on CKD Prior history of CKD with baseline Cr of 1.1-1.2. Initial Cr of 1.40 but downtrended to 1.17 after IVF. Likely prerenal due to poor PO intake.   #Dementia #High risk of hospital delirium MRI with progression of chronic small vessel disease in the left internal capsule and pons compared to MRI last year. Patient initially alert and oriented to name, place, year - had some waxing/waning mentation in hospital but back to baseline at time of discharge. He was given home tramadol, melatonin, and memantine  during his stay.   #Chronic Thrombocytopenia Platelets of 117 which is stable. Past values chronically low in the 100s in the setting of sequestration. No bleeding concerns.    #History of parotid adenocarcinoma s/p resection 2017, XRT 2018 May be contributing to baseline facial droop. Does have baseline swallowing difficulties. SLP eval showing moderate aspiration risk but family declined further workup with MBS and he was placed on a regular diet.    #Goals of Care Upon admission, patient's spouse states that she would like him to be DNR/DNI which is consistent with the patient's wishes. Agree this is reasonable given the patient's worsening dementia and other medical comorbidities.    Discharge Subjective: Ronnie Strickland reports that he is  doing well. He has no acute complaints, nor does his wife at bedside. They are pleased with his negative EEG and excited to return home. Patient is medically ready for discharge.  Discharge Exam:   BP 120/83 (BP Location: Right Arm)   Pulse 70   Temp 98.1 F (36.7 C) (Oral)   Resp 18   Ht 6' 1 (1.854 m)   Wt 67.5 kg   SpO2 99%   BMI 19.63 kg/m  Physical Exam: Constitutional: Well-appearing elderly man in no acute distress HENT: normocephalic atraumatic, mucous membranes moist Eyes: conjunctiva non-erythematous, PERRL, no scleral icterus Cardiovascular: regular rate and rhythm, no m/r/g Pulmonary/Chest: normal work of breathing on room air, lungs clear to auscultation bilaterally Abdominal: soft, non-tender, non-distended, bowel sounds normal Neurological: AO3; answers questions appropriately, but slow and loses train of thought at times (consistent with baseline).  Skin: warm and dry; scattered bruising on upper extremities consistent with admission exam Extremities: no edema or cyanosis; peripheral pulses intact Psych: Difficult to assess thought content; mood congruent with affect.  Pertinent Labs, Studies, and Procedures:     Latest Ref Rng & Units 06/03/2024    5:39 AM 06/02/2024   11:45 AM 06/02/2024   11:43 AM  CBC  WBC 4.0 - 10.5 K/uL 5.0   5.1  Hemoglobin 13.0 - 17.0 g/dL 88.7  87.0  87.2   Hematocrit 39.0 - 52.0 % 32.9  38.0  39.0   Platelets 150 - 400 K/uL 114   117        Latest Ref Rng & Units 06/03/2024    5:39 AM 06/02/2024   11:45 AM 06/02/2024   11:43 AM  CMP  Glucose 70 - 99 mg/dL 73  88  93   BUN 8 - 23 mg/dL 19  26  22    Creatinine 0.61 - 1.24 mg/dL 8.82  8.59  8.59   Sodium 135 - 145 mmol/L 139  142  141   Potassium 3.5 - 5.1 mmol/L 3.7  4.3  4.3   Chloride 98 - 111 mmol/L 105  103  107   CO2 22 - 32 mmol/L 31   29   Calcium  8.9 - 10.3 mg/dL 8.5   8.8   Total Protein 6.5 - 8.1 g/dL   6.4   Total Bilirubin 0.0 - 1.2 mg/dL   0.9   Alkaline Phos 38  - 126 U/L   73   AST 15 - 41 U/L   24   ALT 0 - 44 U/L   18     Ronnie BRAIN WO CONTRAST Result Date: 06/02/2024 EXAM: MRI BRAIN WITHOUT CONTRAST 06/02/2024 12:52:55 PM TECHNIQUE: Multiplanar multisequence MRI of the head/brain was performed without the administration of intravenous contrast. COMPARISON: Brain MRI 05/22/2023. CTA and CT head today reported separately. CLINICAL HISTORY: 73 year old male with mental status change, unknown cause; neuro deficit, acute, stroke suspected. FINDINGS: BRAIN AND VENTRICLES: Intermittent motion artifact despite repeated imaging attempts. Stable brain volume. No intracranial hemorrhage. No mass. No midline shift. No hydrocephalus. Small foci of cerebral white matter T2 and FLAIR hyperintensity in both hemispheres not significantly changed from last year. More moderate T2 and FLAIR heterogeneity in the pons does appear increased (series 8 image 8). No convincing diffusion restriction. No cortical encephalomalacia. Negative deep gray nuclei and cerebellum. Chronic microhemorrhages in the parietal lobes are stable. Left external capsule microhemorrhage on series 5 image 59 is new from last year but appears chronic. Evidence of chronic small vessel disease, progressed in the left internal capsule and the pons since MRI last year. The sella is unremarkable. Normal flow voids. ORBITS: No acute abnormality. SINUSES AND MASTOIDS: Improved paranasal sinus and mastoid aeration. BONES AND SOFT TISSUES: Normal marrow signal. Normal visible cervical spine. No acute soft tissue abnormality. IMPRESSION: 1. No acute infarct or acute intracranial abnormality when allowing for motion artifact. 2. 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 Electronically signed by: Helayne Hurst MD 06/02/2024 01:14 PM EST RP Workstation: [id number]   CT ANGIO HEAD NECK W WO CM (CODE STROKE) Result Date: 06/02/2024 EXAM: CTA HEAD AND NECK WITH AND  WITHOUT 06/02/2024 12:02:00 PM TECHNIQUE: CTA of the head and neck was performed with and without the administration of 75 mL iohexol  (OMNIPAQUE ) 350 MG/ML injection. Multiplanar 2D and/or 3D reformatted images are provided for review. Automated exposure control, iterative reconstruction, and/or weight based adjustment of the mA/kV was utilized to reduce the radiation dose to as low as reasonably achievable. Stenosis of the internal carotid arteries measured using NASCET criteria. COMPARISON: MRA neck 10/17/2016 CLINICAL HISTORY: Neuro deficit, acute, stroke suspected. Facial weakness and slurred speech. FINDINGS: CTA NECK: AORTIC ARCH AND ARCH VESSELS: Mild atherosclerotic calcification in the aortic arch and left subclavian artery. No dissection or arterial  injury. No significant stenosis of the brachiocephalic or subclavian arteries. CERVICAL CAROTID ARTERIES: Mixed calcified and soft plaque at the carotid bifurcations, minimal on the right and mild on the left. No dissection, arterial injury, or hemodynamically significant stenosis by NASCET criteria. CERVICAL VERTEBRAL ARTERIES: Mildly to moderately dominant left vertebral artery. No dissection, arterial injury, or significant stenosis. LUNGS AND MEDIASTINUM: Unremarkable. SOFT TISSUES: Post treatment changes throughout the right neck. No evidence of cervical lymphadenopathy. BONES: Cervical disc degeneration which is most advanced at C5-C6 and C6-C7. CTA HEAD: ANTERIOR CIRCULATION: The intracranial internal carotid arteries are widely patent with minimal nonstenotic atherosclerosis. ACAs and MCAs are patent without evidence of a proximal branch occlusion or significant proximal stenosis. No aneurysm. POSTERIOR CIRCULATION: The intracranial vertebral arteries are widely patent to the basilar. Patent PICA and SCA origins are visualized bilaterally. The basilar artery is widely patent. Posterior communicating arteries are diminutive or absent. Both PCAs are  patent without evidence of a significant proximal stenosis. No aneurysm. OTHER: No dural venous sinus thrombosis on this non-dedicated study. IMPRESSION: 1. Minimal atherosclerosis without a large vessel occlusion or significant proximal stenosis in the head or neck. 2. These results were communicated to Dr. CANDIE Mari at 12:12 PM on 06/02/2024 by secure text page via the [facility] messaging system. Electronically signed by: Dasie Hamburg MD 06/02/2024 12:19 PM EST RP Workstation: [id number]   CT HEAD CODE STROKE WO CONTRAST Result Date: 06/02/2024 EXAM: CT HEAD WITHOUT CONTRAST 06/02/2024 11:54:46 AM TECHNIQUE: CT of the head was performed without the administration of intravenous contrast. Automated exposure control, iterative reconstruction, and/or weight based adjustment of the mA/kV was utilized to reduce the radiation dose to as low as reasonably achievable. COMPARISON: Brain MRI 05/22/2023, Head CT 06/26/2023. CLINICAL HISTORY: 73 year old male with acute neuro deficit, stroke suspected. FINDINGS: BRAIN AND VENTRICLES: Brain volume remains normal for age. No acute hemorrhage. No evidence of acute infarct. No hydrocephalus. No extra-axial collection. No mass effect or midline shift. Gray white differentiation is stable with mild left periventricular white matter hypodensity. Minimal for age white matter changes overall. No suspicious intracranial vascular hyperdensity. ORBITS: No gaze deviation. No acute abnormality. SINUSES: Partial chronic opacification of the right middle ear, but right mastoid aeration appears mildly improved since last year. Paranasal sinuses, left middle ear and mastoid remain well aerated. SOFT TISSUES AND SKULL: Faint calcified atherosclerosis at the skull base. No acute soft tissue abnormality. No skull fracture. [place] stroke program early CT score (aspects) ----- Ganglionic (caudate, ic, lentiform nucleus, insula, M1-m3): 7 Supraganglionic (m4-m6): 3 Total: 10 IMPRESSION: 1. Stable  mild for age cerebral white matter changes. ASPECTS 10. 2. These results were communicated to Doctor Mari at 1203 hours on 06/02/2024 by text page via the [facility] messaging system. Electronically signed by: Helayne Hurst MD 06/02/2024 12:05 PM EST RP Workstation: [id number]     Discharge Instructions:   Discharge Instructions      Ronnie Strickland, Ronnie Strickland were hospitalized due to a fall. Fortunately, you did not suffer a stroke and are not having seizures.  However, due to your history, a seizure may have happened and may happen again, so we will discharge you with Keppra  - an anti-seizure medicine. Take one 500mg  pill in the morning and one in the evening.  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 To address this, consider using an abdominal binder that may be purchased on the internet or at a drugstore or [employer]. The addition of pressure  from that device can keep your blood pressure high enough to reduce the frequency of falls.  Additionally, talk to your physician about your midodrine , which may be useful in preventing falls. Talk to your physician about your trazodone  and gabapentin, as these may promote falls. Consider changing the timing of these medicines or using alternatives such as duloxetine.  You will have a home physical therapist and occupational therapist to help you return to home life. Please expect a phone call to set that up.  As always, stand up slowly and always use your walker.  Take care, Your Internal Medicine Physician team    Signed:  Lonni Africa, MD Internal Medicine Resident 06/04/2024, 12:57 PM Please contact the on call pager after 5 pm and on weekends at [phone].

## 2024-06-03 NOTE — Care Management (Signed)
 Transition of Care [facility]) - Inpatient Brief Assessment   Patient Details  Name: Ronnie Strickland MRN: [id number] Date of Birth: 1951-06-28  Transition of Care [facility]) CM/SW Contact:    Corean JAYSON Canary, RN Phone Number: 06/03/2024, 5:08 PM   Clinical Narrative:  Patient presented with fall strokelike symptoms.  He is generally disoriented in the hospital here. He is hypotensive He walks with a walker normally.  Called patients spouse for collateral information and discharge planning. PT and OT recommend home health. No history of HH in [place].  Left confidential message for spouse to return phone call when able to discuss.    IPCM will follow Transition of Care Asessment: Insurance and Status: Insurance coverage has been reviewed Patient has primary care physician: Yes Home environment has been reviewed: Lives  with apouse Prior level of function:: Independent supervision Prior/Current Home Services: No current home services Social Drivers of Health Review: SDOH reviewed no interventions necessary Readmission risk has been reviewed: Yes Transition of care needs: transition of care needs identified, TOC will continue to follow

## 2024-06-03 NOTE — Progress Notes (Signed)
 OT Cancellation Note  Patient Details Name: Ronnie Strickland MRN: [id number] DOB: [birth year]   Cancelled Treatment:    Reason Eval/Treat Not Completed: Other (comment) (Pt just got back to bed, being hooked up to EEG. Will continue efforts.)   Myquan Schaumburg M. [place], OTR/L [facility] Acute Rehabilitation Services [phone] Secure Chat Preferred  Ardelia Wrede 06/03/2024, 12:04 PM

## 2024-06-03 NOTE — Progress Notes (Signed)
 Physical Therapy Treatment Patient Details Name: Darril Patriarca MRN: [id number] DOB: August 26, 1950 Today's Date: 06/03/2024   History of Present Illness Daden Mahany is a 73 y.o. male who presented to [facility] ED 06/02/24 as a CODE STROKE. Pt had a fall at home and struck his head against a dresser. Presented with  weakness, facial droop, and trouble speaking. CT Head negative, CTA with no LVO, and MRI Brain negative. 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    PT Comments  Pt with fair tolerance to treatment today. Co-treat with OT. Session today limited by low BP. BP: 78/56 (63) supine, BP: 82/51 (58) seated, BP: 94/57 (69) return to supine, RN made aware. Pt reporting no symptoms. No change in DC/DME recs at this time. PT will continue to follow.     If plan is discharge home, recommend the following: A lot of help with walking and/or transfers;A lot of help with bathing/dressing/bathroom;Assistance with cooking/housework;Assist for transportation;Help with stairs or ramp for entrance;Supervision due to cognitive status;Direct supervision/assist for medications management   Can travel by private vehicle        Equipment Recommendations  None recommended by PT;BSC/3in1    Recommendations for Other Services       Precautions / Restrictions Precautions Precautions: Fall Recall of Precautions/Restrictions: Impaired Restrictions Weight Bearing Restrictions Per Provider Order: No     Mobility  Bed Mobility Overal bed mobility: Needs Assistance Bed Mobility: Supine to Sit, Sit to Supine     Supine to sit: Contact guard, HOB elevated Sit to supine: Contact guard assist, HOB elevated   General bed mobility comments: Pt sat up on R side of bed with increased time. He brought BLE off EOB. Assist to elevate trunk and scoot hips fwd. Returning to bed slight assist with BLE. Orthostatics checked.     Transfers                   General transfer comment: Deferred due to low BP.    Ambulation/Gait                   Stairs             Wheelchair Mobility     Tilt Bed    Modified Rankin (Stroke Patients Only)       Balance Overall balance assessment: Needs assistance, History of Falls Sitting-balance support: Bilateral upper extremity supported, Feet supported Sitting balance-Leahy Scale: Fair                                      Hotel Manager: Impaired Factors Affecting Communication: Difficulty expressing self;Reduced clarity of speech  Cognition Arousal: Alert Behavior During Therapy: WFL for tasks assessed/performed, Impulsive   PT - Cognitive impairments: History of cognitive impairments                       PT - Cognition Comments: Slightly impulsive today with pt noted to attempt to stand several times. Following commands: Impaired Following commands impaired: Follows one step commands inconsistently, Follows one step commands with increased time    Cueing Cueing Techniques: Verbal cues, Gestural cues, Tactile cues, Visual cues  Exercises      General Comments General comments (skin integrity, edema, etc.): BP: 78/56 (63) supine, BP: 82/51 (58) seated, BP: 94/57 (  69) return to supine, RN made aware. Pt reporting no symptoms.      Pertinent Vitals/Pain Pain Assessment Pain Assessment: Faces Faces Pain Scale: No hurt    Home Living Family/patient expects to be discharged to:: Private residence Living Arrangements: Spouse/significant other;Other relatives (grandchildren (4,6,8 y/o)) Available Help at Discharge: Family;Available 24 hours/day Type of Home: House Home Access: Stairs to enter;Ramped entrance Entrance Stairs-Rails: None Entrance Stairs-Number of Steps: 2   Home Layout: Two level;Able to live on main level with bedroom/bathroom Home Equipment: Shower seat;Cane  - single point;Rollator (4 wheels);Wheelchair - manual;Grab bars - tub/shower;Grab bars - toilet;Hand held shower head;Other (comment)      Prior Function            PT Goals (current goals can now be found in the care plan section) Progress towards PT goals: Progressing toward goals    Frequency    Min 2X/week      PT Plan      Co-evaluation              AM-PAC PT 6 Clicks Mobility   Outcome Measure  Help needed turning from your back to your side while in a flat bed without using bedrails?: A Little Help needed moving from lying on your back to sitting on the side of a flat bed without using bedrails?: A Little Help needed moving to and from a bed to a chair (including a wheelchair)?: A Lot Help needed standing up from a chair using your arms (e.g., wheelchair or bedside chair)?: A Little Help needed to walk in hospital room?: Total Help needed climbing 3-5 steps with a railing? : Total 6 Click Score: 13    End of Session   Activity Tolerance: Treatment limited secondary to medical complications (Comment) (Low BP) Patient left: in bed;with call bell/phone within reach;with family/visitor present Nurse Communication: Mobility status;Other (comment) PT Visit Diagnosis: Muscle weakness (generalized) (M62.81);History of falling (Z91.81);Repeated falls (R29.6);Other abnormalities of gait and mobility (R26.89);Difficulty in walking, not elsewhere classified (R26.2);Unsteadiness on feet (R26.81)     Time: 8572-8554 PT Time Calculation (min) (ACUTE ONLY): 18 min  Charges:    $Therapeutic Activity: 8-22 mins PT General Charges $$ ACUTE PT VISIT: 1 Visit                     Kashay Cavenaugh B, PT, DPT Acute Rehab Services [phone]    Damesha Lawler 06/03/2024, 4:11 PM

## 2024-06-03 NOTE — Evaluation (Signed)
 Clinical/Bedside Swallow Evaluation Patient Details  Name: Ronnie Strickland MRN: [id number] Date of Birth: 08-13-1950  Today's Date: 06/03/2024 Time: SLP Start Time (ACUTE ONLY): 9144 SLP Stop Time (ACUTE ONLY): 0916 SLP Time Calculation (min) (ACUTE ONLY): 21 min  Past Medical History:  Past Medical History:  Diagnosis Date   Diabetes mellitus type 2, diet-controlled (HCC)    GERD (gastroesophageal reflux disease)    Gout    H/O knee surgery    Hyperlipidemia    Oral cancer (HCC)    Past Surgical History:  Past Surgical History:  Procedure Laterality Date   COLONOSCOPY     COLONOSCOPY WITH PROPOFOL  N/A 03/21/2016   Procedure: COLONOSCOPY WITH PROPOFOL ;  Surgeon: Rogelia Copping, MD;  Location: [facility];  Service: Endoscopy;  Laterality: N/A;   ESOPHAGOGASTRODUODENOSCOPY (EGD) WITH PROPOFOL  N/A 03/21/2016   Procedure: ESOPHAGOGASTRODUODENOSCOPY (EGD) WITH PROPOFOL ;  Surgeon: Rogelia Copping, MD;  Location: [facility];  Service: Endoscopy;  Laterality: N/A;  diabetic - diet controlled   KNEE SURGERY     VASECTOMY     HPI:  Ronnie Strickland is a 73 y.o. male admitted with fall, weakness, facial droop, and trouble speaking. 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  BSE at [facility] 03/2024 discussed aspiration risk, comfort, vs instrumental testing with wife wanting to think about it and recommended continue oral diet as tolerated. 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    Assessment / Plan / Recommendation  Clinical Impression  Pt drowsy, missing majority of dentition, no focal oromotor deficits and decreased awareness with history of dementia and chronic dysphagia. He exhibits  s/s aspiration with intermittent immediate cough with thin and suspected delayed swallow initiation. Masticated trials regular texture timely without residue. Pt's granddaughter arrived stating family is aware of dysphagia initiating 2017 following radiation for salivary gland cancer and frequently coughs with liquids. Discussed options of MBS versus po's with risks and possible outcomes if aspirating. She states her grandfather would not want to have liquids thickend and agrees that he would not be able to follow through with compensatory strategies due to dementia. She would like for him to continue with po's and understands potential risks. Asked her to update pt's wife and she reported her grandmother is aware and asked the MD to just  feed him last night. Recommend regular texture, thin liquids, pills whole in puree (per at home), sit upright and small sips. Reviewed precautions with family. ST will sign off at this time. Updated results with MD who are in agreement with plan/recommendations. Palliative care may be beneficial for goals of care . SLP Visit Diagnosis: Dysphagia, unspecified (R13.10)    Aspiration Risk  Moderate aspiration risk    Diet Recommendation           Other Recommendations Oral Care Recommendations: Oral care BID     Swallow Evaluation Recommendations Recommendations: PO diet PO Diet Recommendation: Regular;Thin liquids (Level 0) Liquid Administration via: Cup;Straw Medication Administration: Whole meds with puree Supervision: Full assist for feeding Swallowing strategies  : Slow rate;Small bites/sips Postural changes: Stay upright 30-60 min after meals Oral care recommendations: Oral care BID (2x/day)   Assistance Recommended at Discharge    Functional Status Assessment Patient has  not had a recent decline in their functional status  Frequency and Duration            Prognosis        Swallow Study   General HPI: Ronnie Strickland is a 73 y.o. male admitted  with fall, weakness, facial droop, and trouble speaking. 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  BSE at [facility] 03/2024 discussed aspiration risk, comfort, vs instrumental testing with wife wanting to think about it and recommended continue oral diet as tolerated. 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 Type of Study: Bedside Swallow Evaluation Previous Swallow Assessment:  (seeHPI) Diet Prior to this Study: Regular;Thin liquids (Level 0) Temperature Spikes Noted: No Respiratory Status: Room air History of Recent Intubation: No Behavior/Cognition: Lethargic/Drowsy;Cooperative;Requires cueing Oral Cavity Assessment: Within Functional Limits Oral Care Completed by SLP: No Oral Cavity - Dentition: Missing dentition (missing majority of dentition) Self-Feeding Abilities: Needs assist Patient Positioning: Upright in bed Baseline Vocal Quality: Low vocal intensity Volitional Cough: Cognitively unable to elicit Volitional Swallow: Unable to elicit    Oral/Motor/Sensory Function Overall Oral Motor/Sensory Function:  (decreased movement due to lethargy -no focal deficits)   Ice Chips Ice chips: Not tested   Thin Liquid Thin Liquid: Impaired Presentation: Cup;Straw Oral Phase Functional Implications: Prolonged oral transit Pharyngeal  Phase Impairments: Suspected delayed Swallow;Cough - Immediate    Nectar Thick Nectar Thick Liquid: Not tested   Honey Thick Honey Thick Liquid: Not tested   Puree Puree: Impaired Pharyngeal Phase Impairments: Suspected delayed Swallow   Solid     Solid: Within functional limits      Ronnie Strickland 06/03/2024,10:03 AM

## 2024-06-03 NOTE — Care Management Obs Status (Signed)
 MEDICARE OBSERVATION STATUS NOTIFICATION   Patient Details  Name: Ronnie Strickland MRN: [id number] Date of Birth: [birth year]   Medicare Observation Status Notification Given:  Yes  Obs letter signed and copy given.   [name] 06/03/2024, 1:47 PM

## 2024-06-04 ENCOUNTER — Other Ambulatory Visit (HOSPITAL_COMMUNITY): Payer: Self-pay

## 2024-06-04 ENCOUNTER — Inpatient Hospital Stay (HOSPITAL_COMMUNITY)

## 2024-06-04 DIAGNOSIS — R569 Unspecified convulsions: Secondary | ICD-10-CM

## 2024-06-04 LAB — LEVETIRACETAM LEVEL: Levetiracetam Lvl: 10.7 ug/mL (ref 10.0–40.0)

## 2024-06-04 MED ORDER — LEVETIRACETAM 500 MG PO TABS
500.0000 mg | ORAL_TABLET | Freq: Two times a day (BID) | ORAL | 0 refills | Status: AC
  Filled 2024-06-04: qty 120, 60d supply, fill #0

## 2024-06-04 NOTE — Progress Notes (Signed)
**Note Ronnie-Identified via Obfuscation**  NEUROLOGY CONSULT FOLLOW UP NOTE   Date of service: June 04, 2024 Patient Name: Ronnie Strickland MRN:  [id number] DOB:  Apr 07, 1951  Interval Hx/subjective   Symptoms hve resolved. He is close to his baseline. Discontinue LTM EEG. Negative for seizure.  Vitals   Vitals:   06/03/24 2019 06/03/24 2338 06/04/24 0355 06/04/24 1000  BP: (!) 156/107 111/67 (!) 144/89 120/83  Pulse: 78 (!) 56 (!) 57 70  Resp: 17 17 17 18   Temp: 98.3 F (36.8 C) 97.7 F (36.5 C) (!) 97.5 F (36.4 C) 98.1 F (36.7 C)  TempSrc: Oral Oral Oral Oral  SpO2: 100% 97% 99% 99%  Weight:      Height:         Body mass index is 19.63 kg/m.  Physical Exam   General: Laying comfortably in bed; in no acute distress.  HENT: Normal oropharynx and mucosa. Normal external appearance of ears and nose.  Neck: Supple, no pain or tenderness  CV: No JVD. No peripheral edema.  Pulmonary: Symmetric Chest rise. Normal respiratory effort.  Abdomen: Soft to touch, non-tender.  Ext: No cyanosis, edema, or deformity  Skin: No rash. Normal palpation of skin.   Musculoskeletal: Normal digits and nails by inspection. No clubbing.   Neurologic Examination  Mental status/Cognition: Alert, bradyphernia, oriented to self, place, month and year, good attention.  Speech/language: Fluent, comprehension intact, object naming intact, repetition intact. Cranial nerves:   CN II Pupils equal and reactive to light, no VF deficits    CN III,IV,VI EOM intact, no gaze preference or deviation, no nystagmus    CN V normal sensation in V1, V2, and V3 segments bilaterally    CN VII no asymmetry, no nasolabial fold flattening    CN VIII normal hearing to speech    CN IX & X normal palatal elevation, no uvular deviation    CN XI 5/5 head turn and 5/5 shoulder shrug bilaterally    CN XII midline tongue protrusion    Motor:  Muscle bulk: normal, tone normal, pronator drift none tremor none Mvmt Root Nerve  Muscle Right Left Comments  SA C5/6  Ax Deltoid     EF C5/6 Mc Biceps 5 5   EE C6/7/8 Rad Triceps 5 5   WF C6/7 Med FCR     WE C7/8 PIN ECU     F Ab C8/T1 U ADM/FDI 5 5   HF L1/2/3 Fem Illopsoas 5 5   KE L2/3/4 Fem Quad     DF L4/5 D Peron Tib Ant 5 5   PF S1/2 Tibial Grc/Sol 5 5    Sensation:  Light touch Intact throughout   Pin prick    Temperature    Vibration   Proprioception    Coordination/Complex Motor:  - Finger to Nose intact BL - Gait: deferred  Medications  Current Facility-Administered Medications:    acetaminophen  (TYLENOL ) tablet 650 mg, 650 mg, Oral, Q6H PRN, 650 mg at 06/03/24 1324 **OR** acetaminophen  (TYLENOL ) suppository 650 mg, 650 mg, Rectal, Q6H PRN, Ronnie Strickland, Christopher, DO   atorvastatin  (LIPITOR) tablet 40 mg, 40 mg, Oral, Daily, Ronnie Strickland, Christopher, DO, 40 mg at 06/04/24 0951   cholecalciferol  (VITAMIN D3) 25 MCG (1000 UNIT) tablet 2,000 Units, 2,000 Units, Oral, Daily, Ronnie Strickland, Christopher, DO, 2,000 Units at 06/04/24 0951   enoxaparin  (LOVENOX ) injection 40 mg, 40 mg, Subcutaneous, Q24H, Ronnie Strickland, Christopher, DO, 40 mg at 06/03/24 1418   levETIRAcetam  (KEPPRA ) undiluted injection 500 mg, 500 mg, Intravenous, Q12H, Ronnie Strickland, Ronnie [employer]  E, NP, 500 mg at 06/03/24 2331   melatonin tablet 3 mg, 3 mg, Oral, QHS, Ronnie Pac, DO, 3 mg at 06/03/24 2112   memantine  (NAMENDA ) tablet 10 mg, 10 mg, Oral, BID, Ronnie Strickland, Christopher, DO, 10 mg at 06/04/24 0951   pantoprazole  (PROTONIX ) EC tablet 40 mg, 40 mg, Oral, Daily, Ronnie Strickland, Christopher, DO, 40 mg at 06/04/24 0951   polyethylene glycol (MIRALAX  / GLYCOLAX ) packet 17 g, 17 g, Oral, Daily, Ronnie Strickland, Christopher, DO, 17 g at 06/04/24 0951   senna-docusate (Senokot-S) tablet 1 tablet, 1 tablet, Oral, QHS, Ronnie Strickland, Emilie, DO, 1 tablet at 06/03/24 2112   traZODone  (DESYREL ) tablet 50 mg, 50 mg, Oral, QHS PRN, Ronnie Pac, DO, 50 mg at 06/03/24 2112  Labs and Diagnostic Imaging   CBC:  Recent Labs  Lab 06/02/24 1143 06/02/24 1145 06/03/24 0539   WBC 5.1  --  5.0  NEUTROABS 3.8  --   --   HGB 12.7* 12.9* 11.2*  HCT 39.0 38.0* 32.9*  MCV 96.5  --  94.8  PLT 117*  --  114*    Basic Metabolic Panel:  Lab Results  Component Value Date   NA 139 06/03/2024   K 3.7 06/03/2024   CO2 31 06/03/2024   GLUCOSE 73 06/03/2024   BUN 19 06/03/2024   CREATININE 1.17 06/03/2024   CALCIUM  8.5 (L) 06/03/2024   GFRNONAA >60 06/03/2024   GFRAA >60 10/16/2016   Lipid Panel:  Lab Results  Component Value Date   LDLCALC 36 10/17/2016   HgbA1c:  Lab Results  Component Value Date   HGBA1C 5.2 10/17/2016   Urine Drug Screen:     Component Value Date/Time   LABOPIA NONE DETECTED 10/16/2016 2304   COCAINSCRNUR NONE DETECTED 10/16/2016 2304   LABBENZ NONE DETECTED 10/16/2016 2304   AMPHETMU NONE DETECTED 10/16/2016 2304   THCU NONE DETECTED 10/16/2016 2304   LABBARB NONE DETECTED 10/16/2016 2304    Alcohol Level     Component Value Date/Time   ETH <15 06/02/2024 1143   INR  Lab Results  Component Value Date   INR 1.1 06/02/2024   APTT  Lab Results  Component Value Date   APTT 29 06/02/2024   AED levels:  Lab Results  Component Value Date   LEVETIRACETA 10.7 06/02/2024    CT Head without contrast(Personally reviewed): CTH was negative for a large hypodensity concerning for a large territory infarct or hyperdensity concerning for an ICH  CT angio Head and Neck with contrast(Personally reviewed): No LVO  MRI Brain(Personally reviewed): 1. No acute infarct or acute intracranial abnormality when allowing for motion artifact. 2. 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  cEEG:  pending  Assessment  Ronnie Strickland is a 73 y.o. 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  On exam today, he is back to baseline with no focal deficits. Yesterday,, felt to have ?Rfacial weakness and R sided neglect.  Workup so far with MRI negative. Had similar episode in [place] of this year and started on Keppra  and compliant with Keppra  with therapeutic Keppra  levels. LTM EEG has been negative for seizures.  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 Low  clinical suspicion for seizure and will discontinue LTM.  Recommendations  - discontinue LTM EEG. - continue Keppra  500 BID. ______________________________________________________________________  Plan discussed with family at the bedside.  Signed, Ronnie Degrasse, MD [facility]

## 2024-06-04 NOTE — Discharge Instructions (Signed)
 Mr Brasen, Bundren were hospitalized due to a fall. Fortunately, you did not suffer a stroke and are not having seizures.  However, due to your history, a seizure may have happened and may happen again, so we will discharge you with Keppra  - an anti-seizure medicine. Take one 500mg  pill in the morning and one in the evening.  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 To address this, consider using an abdominal binder that may be purchased on the internet or at a drugstore or [employer]. The addition of pressure from that device can keep your blood pressure high enough to reduce the frequency of falls.  Additionally, talk to your physician about your midodrine , which may be useful in preventing falls. Talk to your physician about your trazodone  and gabapentin, as these may promote falls. Consider changing the timing of these medicines or using alternatives such as duloxetine.  You will have a home physical therapist and occupational therapist to help you return to home life. Please expect a phone call to set that up.  As always, stand up slowly and always use your walker.  Take care, Your Internal Medicine Physician team

## 2024-06-04 NOTE — Procedures (Signed)
 Patient Name: Luisenrique Conran  MRN: [id number]  Epilepsy Attending: Arlin MALVA Krebs  Referring Physician/Provider: everitt Clint Abbey Earle FORBES, NP  Duration: 06/03/2024 1209 to 06/04/2024 1221  Patient history: 73 y.o. male was found to have right facial weakness, dysarthria and inability to follow commands. EEG to evaluate for seizure  Level of alertness: Awake, asleep  AEDs during EEG study: LEV  Technical aspects: This EEG study was done with scalp electrodes positioned according to the 10-20 International system of electrode placement. Electrical activity was reviewed with band pass filter of 1-70Hz , sensitivity of 7 uV/mm, display speed of 36mm/sec with a 60Hz  notched filter applied as appropriate. EEG data were recorded continuously and digitally stored.  Video monitoring was available and reviewed as appropriate.  Description: The posterior dominant rhythm consists of 7.5 Hz activity of moderate voltage (25-35 uV) seen predominantly in posterior head regions, symmetric and reactive to eye opening and eye closing. Sleep was characterized by vertex waves, sleep spindles (12 to 14 Hz), maximal frontocentral region. There is intermittent generalized p3 to 6 Hz theta-delta slowing. Hyperventilation and photic stimulation were not performed.     ABNORMALITY - Intermittent slow, generalized  IMPRESSION: This study is suggestive of mild generalized cerebral dysfunction (encephalopathy). No seizures or epileptiform discharges were seen throughout the recording.   Daiveon Markman O Faraz Ponciano

## 2024-06-04 NOTE — Plan of Care (Signed)
  Problem: Health Behavior/Discharge Planning: Goal: Ability to manage health-related needs will improve Outcome: Progressing   Problem: Clinical Measurements: Goal: Ability to maintain clinical measurements within normal limits will improve Outcome: Progressing Goal: Will remain free from infection Outcome: Progressing Goal: Diagnostic test results will improve Outcome: Progressing Goal: Respiratory complications will improve Outcome: Progressing Goal: Cardiovascular complication will be avoided Outcome: Progressing   Problem: Activity: Goal: Risk for activity intolerance will decrease Outcome: Progressing   Problem: Nutrition: Goal: Adequate nutrition will be maintained Outcome: Progressing   Problem: Coping: Goal: Level of anxiety will decrease Outcome: Progressing   Problem: Elimination: Goal: Will not experience complications related to bowel motility Outcome: Progressing Goal: Will not experience complications related to urinary retention Outcome: Progressing   Problem: Pain Managment: Goal: General experience of comfort will improve and/or be controlled Outcome: Progressing   Problem: Safety: Goal: Ability to remain free from injury will improve Outcome: Progressing   Problem: Skin Integrity: Goal: Risk for impaired skin integrity will decrease Outcome: Progressing   Problem: Education: Goal: Knowledge of General Education information will improve Description: Including pain rating scale, medication(s)/side effects and non-pharmacologic comfort measures Outcome: Not Progressing

## 2024-06-04 NOTE — Plan of Care (Signed)
  Problem: Clinical Measurements: Goal: Ability to maintain clinical measurements within normal limits will improve Outcome: Progressing Goal: Will remain free from infection Outcome: Progressing Goal: Diagnostic test results will improve Outcome: Progressing Goal: Respiratory complications will improve Outcome: Progressing Goal: Cardiovascular complication will be avoided Outcome: Progressing   Problem: Activity: Goal: Risk for activity intolerance will decrease Outcome: Progressing   Problem: Nutrition: Goal: Adequate nutrition will be maintained Outcome: Progressing   Problem: Pain Managment: Goal: General experience of comfort will improve and/or be controlled Outcome: Progressing   Problem: Elimination: Goal: Will not experience complications related to bowel motility Outcome: Progressing Goal: Will not experience complications related to urinary retention Outcome: Progressing   Problem: Safety: Goal: Ability to remain free from injury will improve Outcome: Progressing   Problem: Skin Integrity: Goal: Risk for impaired skin integrity will decrease Outcome: Progressing

## 2024-06-04 NOTE — Plan of Care (Signed)
  Problem: Acute Rehab PT Goals(only PT should resolve) Goal: Pt Will Go Supine/Side To Sit Outcome: Adequate for Discharge Goal: Pt Will Go Sit To Supine/Side Outcome: Adequate for Discharge Goal: Patient Will Transfer Sit To/From Stand Outcome: Adequate for Discharge Goal: Pt Will Transfer Bed To Chair/Chair To Bed Outcome: Adequate for Discharge Goal: Pt Will Ambulate Outcome: Adequate for Discharge Goal: PT Additional Goal #1 Outcome: Adequate for Discharge   Problem: Education: Goal: Knowledge of General Education information will improve Description: Including pain rating scale, medication(s)/side effects and non-pharmacologic comfort measures 06/04/2024 1303 by Johnie Will HERO, RN Outcome: Adequate for Discharge 06/04/2024 0809 by Johnie Will HERO, RN Outcome: Not Progressing   Problem: Health Behavior/Discharge Planning: Goal: Ability to manage health-related needs will improve 06/04/2024 1303 by Johnie Will HERO, RN Outcome: Adequate for Discharge 06/04/2024 0809 by Johnie Will HERO, RN Outcome: Progressing   Problem: Clinical Measurements: Goal: Ability to maintain clinical measurements within normal limits will improve 06/04/2024 1303 by Johnie Will HERO, RN Outcome: Adequate for Discharge 06/04/2024 0809 by Johnie Will HERO, RN Outcome: Progressing Goal: Will remain free from infection 06/04/2024 1303 by Johnie Will HERO, RN Outcome: Adequate for Discharge 06/04/2024 0809 by Johnie Will HERO, RN Outcome: Progressing Goal: Diagnostic test results will improve 06/04/2024 1303 by Johnie Will HERO, RN Outcome: Adequate for Discharge 06/04/2024 0809 by Johnie Will HERO, RN Outcome: Progressing Goal: Respiratory complications will improve 06/04/2024 1303 by Johnie Will HERO, RN Outcome: Adequate for Discharge 06/04/2024 0809 by Johnie Will HERO, RN Outcome: Progressing Goal: Cardiovascular complication will be avoided 06/04/2024 1303 by  Johnie Will HERO, RN Outcome: Adequate for Discharge 06/04/2024 0809 by Johnie Will HERO, RN Outcome: Progressing   Problem: Activity: Goal: Risk for activity intolerance will decrease 06/04/2024 1303 by Johnie Will HERO, RN Outcome: Adequate for Discharge 06/04/2024 0809 by Johnie Will HERO, RN Outcome: Progressing   Problem: Nutrition: Goal: Adequate nutrition will be maintained 06/04/2024 1303 by Johnie Will HERO, RN Outcome: Adequate for Discharge 06/04/2024 0809 by Johnie Will HERO, RN Outcome: Progressing   Problem: Coping: Goal: Level of anxiety will decrease 06/04/2024 1303 by Johnie Will HERO, RN Outcome: Adequate for Discharge 06/04/2024 0809 by Johnie Will HERO, RN Outcome: Progressing   Problem: Elimination: Goal: Will not experience complications related to bowel motility 06/04/2024 1303 by Johnie Will HERO, RN Outcome: Adequate for Discharge 06/04/2024 0809 by Johnie Will HERO, RN Outcome: Progressing Goal: Will not experience complications related to urinary retention 06/04/2024 1303 by Johnie Will HERO, RN Outcome: Adequate for Discharge 06/04/2024 0809 by Johnie Will HERO, RN Outcome: Progressing   Problem: Pain Managment: Goal: General experience of comfort will improve and/or be controlled 06/04/2024 1303 by Johnie Will HERO, RN Outcome: Adequate for Discharge 06/04/2024 0809 by Johnie Will HERO, RN Outcome: Progressing   Problem: Safety: Goal: Ability to remain free from injury will improve 06/04/2024 1303 by Johnie Will HERO, RN Outcome: Adequate for Discharge 06/04/2024 0809 by Johnie Will HERO, RN Outcome: Progressing   Problem: Skin Integrity: Goal: Risk for impaired skin integrity will decrease 06/04/2024 1303 by Johnie Will HERO, RN Outcome: Adequate for Discharge 06/04/2024 0809 by Johnie Will HERO, RN Outcome: Progressing   Problem: Acute Rehab OT Goals (only OT should resolve) Goal: Pt. Will  Perform Grooming Outcome: Adequate for Discharge Goal: Pt. Will Transfer To Toilet Outcome: Adequate for Discharge Goal: Pt. Will Perform Toileting-Clothing Manipulation Outcome: Adequate for Discharge Goal: OT Additional ADL Goal #1 Outcome: Adequate for Discharge

## 2024-06-04 NOTE — Progress Notes (Signed)
 LTM EEG disconnected - no skin breakdown at [facility]. [facility] notified.

## 2024-06-22 NOTE — Progress Notes (Signed)
 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
